# Patient Record
Sex: Male | Born: 1935 | Race: White | Hispanic: No | Marital: Married | Smoking: Former smoker
Health system: Southern US, Community
[De-identification: ages and names within clinical notes are randomized; demographics above are authoritative.]

## PROBLEM LIST (undated history)

## (undated) DIAGNOSIS — E785 Hyperlipidemia, unspecified: Secondary | ICD-10-CM

## (undated) DIAGNOSIS — M1990 Unspecified osteoarthritis, unspecified site: Secondary | ICD-10-CM

## (undated) DIAGNOSIS — I10 Essential (primary) hypertension: Secondary | ICD-10-CM

## (undated) HISTORY — DX: Unspecified osteoarthritis, unspecified site: M19.90

## (undated) HISTORY — PX: EYE SURGERY: SHX253

## (undated) HISTORY — PX: APPENDECTOMY: SHX54

## (undated) HISTORY — PX: CARPAL TUNNEL RELEASE: SHX101

## (undated) HISTORY — DX: Essential (primary) hypertension: I10

## (undated) HISTORY — PX: TONSILLECTOMY: SUR1361

## (undated) HISTORY — DX: Hyperlipidemia, unspecified: E78.5

## (undated) HISTORY — PX: CATARACT EXTRACTION, BILATERAL: SHX1313

---

## 2004-04-17 ENCOUNTER — Encounter: Admission: RE | Admit: 2004-04-17 | Discharge: 2004-04-17 | Payer: Self-pay | Admitting: Internal Medicine

## 2007-11-01 ENCOUNTER — Ambulatory Visit: Payer: Self-pay | Admitting: *Deleted

## 2007-11-01 ENCOUNTER — Encounter: Payer: Self-pay | Admitting: Gastroenterology

## 2007-11-01 ENCOUNTER — Ambulatory Visit: Payer: Self-pay | Admitting: Gastroenterology

## 2007-11-01 DIAGNOSIS — Z860100 Personal history of colon polyps, unspecified: Secondary | ICD-10-CM | POA: Insufficient documentation

## 2007-11-01 DIAGNOSIS — IMO0001 Reserved for inherently not codable concepts without codable children: Secondary | ICD-10-CM

## 2007-11-01 DIAGNOSIS — I10 Essential (primary) hypertension: Secondary | ICD-10-CM | POA: Insufficient documentation

## 2007-12-09 ENCOUNTER — Ambulatory Visit: Payer: Self-pay | Admitting: Gastroenterology

## 2010-11-25 NOTE — Procedures (Signed)
DUPLEX DEEP VENOUS EXAM - LOWER EXTREMITY   INDICATION:  Rule out bilateral DVT.  Patient has bilateral ankle  swelling.   HISTORY:  Edema:  Both ankles.  Trauma/Surgery:  No.  Pain:  No.  PE:  No.  Previous DVT:  No.  Anticoagulants:  Other:   DUPLEX EXAM:                CFV   SFV   PopV  PTV    GSV                R  L  R  L  R  L  R   L  R  L  Thrombosis    o  o  o  o  o  o  o   o  o  o  Spontaneous   +  +  +  +  +  +  +   +  +  +  Phasic        +  +  +  +  +  +  +   +  +  +  Augmentation  +  +  +  +  +  +  +   +  +  +  Compressible  +  +  +  +  +  +  +   +  +  +  Competent     +  +  +  +  +  +  +   +  +  +   Legend:  + - yes  o - no  p - partial  D - decreased    IMPRESSION:  1. No evidence of bilateral deep venous thrombosis.  2. Both posterior tibial artery signals are within normal limits.   Dr. Wylene Simmer was notified of these results, and a preliminary copy was  faxed to his office.      _____________________________  P. Liliane Bade, M.D.   DP/MEDQ  D:  11/01/2007  T:  11/01/2007  Job:  [id number]

## 2011-06-11 ENCOUNTER — Other Ambulatory Visit: Payer: Self-pay | Admitting: Dermatology

## 2013-02-04 ENCOUNTER — Ambulatory Visit (INDEPENDENT_AMBULATORY_CARE_PROVIDER_SITE_OTHER): Payer: Medicare Other | Admitting: Emergency Medicine

## 2013-02-04 VITALS — BP 136/80 | HR 62 | Temp 97.8°F | Resp 20 | Ht 65.0 in | Wt 181.4 lb

## 2013-02-04 DIAGNOSIS — L299 Pruritus, unspecified: Secondary | ICD-10-CM

## 2013-02-04 DIAGNOSIS — R21 Rash and other nonspecific skin eruption: Secondary | ICD-10-CM

## 2013-02-04 LAB — POCT CBC
Granulocyte percent: 60.4 %G (ref 37–80)
HCT, POC: 51.3 % (ref 43.5–53.7)
Hemoglobin: 16.7 g/dL (ref 14.1–18.1)
Lymph, poc: 2.1 (ref 0.6–3.4)
MCH, POC: 31.8 pg — AB (ref 27–31.2)
MCHC: 32.6 g/dL (ref 31.8–35.4)
MCV: 97.8 fL — AB (ref 80–97)
MID (cbc): 0.6 (ref 0–0.9)
MPV: 9.4 fL (ref 0–99.8)
POC Granulocyte: 4.2 (ref 2–6.9)
POC LYMPH PERCENT: 30.7 %L (ref 10–50)
POC MID %: 8.9 %M (ref 0–12)
Platelet Count, POC: 178 10*3/uL (ref 142–424)
RBC: 5.25 M/uL (ref 4.69–6.13)
RDW, POC: 13.8 %
WBC: 7 10*3/uL (ref 4.6–10.2)

## 2013-02-04 NOTE — Patient Instructions (Addendum)
International units cream to the back twice a day. You can try Claritin 10 mg one a day for itching

## 2013-02-04 NOTE — Progress Notes (Signed)
  Subjective:    Patient ID: Stephen Turner, male    DOB: 1922-10-24, 79 y.o.   MRN: [id number]  HPI pt presents with itchy rash on back. It started a [place] weeks ago at the beach. He used spray on sun screen when he noticed itching the day after using the sun screen. He went to dermatologist and was givien Clobetasol which relieved symptoms. He is having trouble sleeping with the itching. Wife does not have sx.  Review of Systems     Objective:   Physical Exam there is a maculopapular rash over the back of his neck. There are isolated raised red areas on the upper and lower extremities Results for orders placed in visit on 02/04/13  POCT CBC      Result Value Range   WBC 7.0  4.6 - 10.2 K/uL   Lymph, poc 2.1  0.6 - 3.4   POC LYMPH PERCENT 30.7  10 - 50 %L   MID (cbc) 0.6  0 - 0.9   POC MID % 8.9  0 - 12 %M   POC Granulocyte 4.2  2 - 6.9   Granulocyte percent 60.4  37 - 80 %G   RBC 5.25  4.69 - 6.13 M/uL   Hemoglobin 16.7  14.1 - 18.1 g/dL   HCT, POC 40.9  81.1 - 53.7 %   MCV 97.8 (*) 80 - 97 fL   MCH, POC 31.8 (*) 27 - 31.2 pg   MCHC 32.6  31.8 - 35.4 g/dL   RDW, POC 91.4     Platelet Count, POC 178  142 - 424 K/uL   MPV 9.4  0 - 99.8 fL         Assessment & Plan:  This still appears to be some type of contact reaction. He will, the clobetasol cream twice a day to his back. I recommended he try either Claritin or Zyrtec one a day for the itching. He will continue with Aveeno. CBC will be done .

## 2013-06-19 ENCOUNTER — Ambulatory Visit
Admission: RE | Admit: 2013-06-19 | Discharge: 2013-06-19 | Disposition: A | Discharge status: Home / Self Care | Payer: Medicare Other | Source: Ambulatory Visit | Attending: Chiropractic Medicine | Admitting: Chiropractic Medicine

## 2013-06-19 ENCOUNTER — Other Ambulatory Visit: Payer: Self-pay | Admitting: Chiropractic Medicine

## 2013-06-19 DIAGNOSIS — M545 Low back pain: Secondary | ICD-10-CM

## 2014-04-11 ENCOUNTER — Encounter: Payer: Self-pay | Admitting: Gastroenterology

## 2016-06-08 ENCOUNTER — Emergency Department (HOSPITAL_COMMUNITY): Payer: Medicare Other

## 2016-06-08 ENCOUNTER — Emergency Department (HOSPITAL_COMMUNITY)
Admission: EM | Admit: 2016-06-08 | Discharge: 2016-06-08 | Disposition: A | Discharge status: Home / Self Care | Payer: Medicare Other | Attending: Emergency Medicine | Admitting: Emergency Medicine

## 2016-06-08 ENCOUNTER — Encounter (HOSPITAL_COMMUNITY): Payer: Self-pay | Admitting: Emergency Medicine

## 2016-06-08 DIAGNOSIS — I10 Essential (primary) hypertension: Secondary | ICD-10-CM | POA: Diagnosis not present

## 2016-06-08 DIAGNOSIS — Z87891 Personal history of nicotine dependence: Secondary | ICD-10-CM | POA: Insufficient documentation

## 2016-06-08 DIAGNOSIS — R0789 Other chest pain: Secondary | ICD-10-CM | POA: Insufficient documentation

## 2016-06-08 DIAGNOSIS — R079 Chest pain, unspecified: Secondary | ICD-10-CM | POA: Diagnosis present

## 2016-06-08 LAB — BASIC METABOLIC PANEL
Anion gap: 7 (ref 5–15)
BUN: 33 mg/dL — ABNORMAL HIGH (ref 6–20)
CO2: 26 mmol/L (ref 22–32)
Calcium: 9.2 mg/dL (ref 8.9–10.3)
Chloride: 104 mmol/L (ref 101–111)
Creatinine, Ser: 1.1 mg/dL (ref 0.61–1.24)
GFR calc Af Amer: >60 mL/min (ref >60)
GFR calc non Af Amer: 57 mL/min — ABNORMAL LOW (ref >60)
Glucose, Bld: 146 mg/dL — ABNORMAL HIGH (ref 65–99)
Potassium: 4.3 mmol/L (ref 3.5–5.1)
Sodium: 137 mmol/L (ref 135–145)

## 2016-06-08 LAB — CBC
HCT: 47.1 % (ref 39.0–52.0)
Hemoglobin: 16.1 g/dL (ref 13.0–17.0)
MCH: 31.8 pg (ref 26.0–34.0)
MCHC: 34.2 g/dL (ref 30.0–36.0)
MCV: 93.1 fL (ref 78.0–100.0)
Platelets: 160 10*3/uL (ref 150–400)
RBC: 5.06 MIL/uL (ref 4.22–5.81)
RDW: 13.5 % (ref 11.5–15.5)
WBC: 5.9 10*3/uL (ref 4.0–10.5)

## 2016-06-08 LAB — I-STAT TROPONIN, ED: Troponin i, poc: 0 ng/mL (ref 0.00–0.08)

## 2016-06-08 NOTE — ED Triage Notes (Signed)
Pt. reports intermittent left chest pain with mild SOB and diaphoresis onset last week , denies emesis or cough .

## 2016-06-08 NOTE — ED Provider Notes (Signed)
[facility] DEPT Provider Note   CSN: [id number] Arrival date & time: 06/08/16  1956     History   Chief Complaint Chief Complaint  Patient presents with  . Chest Pain    HPI BRAEDY SHAWLEY is a 82 y.o. male.  HPI Patient presents with concern of episodic left-sided upper chest pain. Episodes of been present for about one week, without clear precipitant. However, the patient does note that about one week ago his wife, who had a stroke recently, moved back into his house, and he has been lifting her sometimes. However, the pain is not persistent, is not focally about the left shoulder. No dyspnea. Patient states that he is otherwise generally well, takes medication for hypertension prophylaxis. He denies history of cardiac disease. The pain is sore, tight. History reviewed. No pertinent past medical history.  Patient Active Problem List   Diagnosis Date Noted  . BENIGN ESSENTIAL HYPERTENSION PREVIOUS PPC 11/01/2007  . PERSONAL HISTORY OF COLONIC POLYPS 11/01/2007    Past Surgical History:  Procedure Laterality Date  . APPENDECTOMY    . EYE SURGERY    . TONSILLECTOMY         Home Medications    Prior to Admission medications   Medication Sig Start Date End Date Taking? Authorizing Provider  amLODipine (NORVASC) 5 MG tablet Take 5 mg by mouth daily.    Historical Provider, MD  clobetasol cream (TEMOVATE) 0.05 % Apply topically 2 (two) times daily.    Historical Provider, MD  diphenhydrAMINE (BENADRYL) 2 % cream Apply topically 3 (three) times daily as needed for itching.    Historical Provider, MD  hydrocortisone cream 1 % Apply topically 2 (two) times daily.    Historical Provider, MD  ibuprofen (ADVIL,MOTRIN) 200 MG tablet Take 200 mg by mouth every 6 (six) hours as needed for pain.    Historical Provider, MD  lovastatin (MEVACOR) 40 MG tablet Take 40 mg by mouth at bedtime.    Historical Provider, MD    Family History Family History  Problem Relation Age  of Onset  . Stroke Father   . Cancer Brother   . Celiac disease Daughter   . COPD Son   . Heart disease Brother     Social History Social History  Substance Use Topics  . Smoking status: Former Research scientist (life sciences)  . Smokeless tobacco: Never Used  . Alcohol use Yes     Allergies   Patient has no known allergies.   Review of Systems Review of Systems  Constitutional:       Per HPI, otherwise negative  HENT:       Per HPI, otherwise negative  Respiratory:       Per HPI, otherwise negative  Cardiovascular:       Per HPI, otherwise negative  Gastrointestinal: Negative for vomiting.  Endocrine:       Negative aside from HPI  Genitourinary:       Neg aside from HPI   Musculoskeletal:       Per HPI, otherwise negative  Skin: Negative.   Neurological: Negative for syncope.     Physical Exam Updated Vital Signs BP 150/72 (BP Location: Left Arm)   Pulse 80   Temp 97.6 F (36.4 C) (Oral)   Resp 16   Ht 5\' 5"  (1.651 m)   Wt 175 lb (79.4 kg)   SpO2 97%   BMI 29.12 kg/m   Physical Exam  Constitutional: He is oriented to person, place, and time. He appears well-developed.  No distress.  HENT:  Head: Normocephalic and atraumatic.  Eyes: Conjunctivae and EOM are normal.  Cardiovascular: Normal rate and regular rhythm.   Pulmonary/Chest: Effort normal. No stridor. No respiratory distress.  Abdominal: He exhibits no distension.  Musculoskeletal: He exhibits no edema.       Right shoulder: He exhibits decreased range of motion. He exhibits no tenderness and no bony tenderness.       Left shoulder: Normal.       Arms: Neurological: He is alert and oriented to person, place, and time.  Skin: Skin is warm and dry.  Psychiatric: He has a normal mood and affect.  Nursing note and vitals reviewed.    ED Treatments / Results  Labs (all labs ordered are listed, but only abnormal results are displayed) Labs Reviewed  BASIC METABOLIC PANEL - Abnormal; Notable for the following:        Result Value   Glucose, Bld 146 (*)    BUN 33 (*)    GFR calc non Af Amer 57 (*)    All other components within normal limits  CBC  I-STAT TROPOININ, ED    EKG  EKG Interpretation  Date/Time:  Monday June 08 2016 20:12:45 EST Ventricular Rate:  73 PR Interval:  158 QRS Duration: 78 QT Interval:  400 QTC Calculation: 440 R Axis:   31 Text Interpretation:  Normal sinus rhythm Normal ECG Normal ECG Confirmed by Carmin Muskrat  MD [phone]) on 06/08/2016 9:02:46 PM       Radiology Dg Chest 2 View  Result Date: 06/08/2016 CLINICAL DATA:  Chest pain off and on EXAM: CHEST  2 VIEW COMPARISON:  None. FINDINGS: Streaky atelectasis, scar, or infiltrate at the lingula. No focal consolidation or effusion. Heart size within normal limits. Atherosclerosis of the aorta. No pneumothorax. IMPRESSION: Streaky atelectasis, scar or infiltrate at the lingula. Atherosclerosis of the aorta. Electronically Signed   By: Donavan Foil M.D.   On: 06/08/2016 20:59    Procedures Procedures (including critical care time)    Initial Impression / Assessment and Plan / ED Course  I have reviewed the triage vital signs and the nursing notes.  Pertinent labs & imaging results that were available during my care of the patient were reviewed by me and considered in my medical decision making (see chart for details).  Clinical Course     9:48 PM Patient in no distress awake alert. I discussed all findings with him and his family members. We discussed the reassuring labs, EKG, x-ray. We discussed importance of following up with primary care, though tolerates reassuring results, not suspicious for PE, ACS, pneumonia or other acute new pathology. With mild point tenderness about the left sternal border, there suspicion for soft tissue injury. Patient discharged in stable condition. Final Clinical Impressions(s) / ED Diagnoses   Final diagnoses:  Atypical chest pain     Carmin Muskrat,  MD 06/08/16 2149

## 2016-06-08 NOTE — Discharge Instructions (Signed)
As discussed, your evaluation today has been largely reassuring.  But, it is important that you monitor your condition carefully, and do not hesitate to return to the ED if you develop new, or concerning changes in your condition. ? ?Otherwise, please follow-up with your physician for appropriate ongoing care. ? ?

## 2019-07-29 ENCOUNTER — Ambulatory Visit: Payer: Medicare Other | Attending: Internal Medicine

## 2019-07-29 DIAGNOSIS — Z23 Encounter for immunization: Secondary | ICD-10-CM | POA: Insufficient documentation

## 2019-07-29 NOTE — Progress Notes (Signed)
   Covid-19 Vaccination Clinic  Name:  Stephen Turner    MRN: [id number] DOB: 26-Jul-1922  07/29/2019  [name] was observed post Covid-19 immunization for 15 minutes without incidence. He was provided with Vaccine Information Sheet and instruction to access the V-Safe system.   [name] was instructed to call 911 with any severe reactions post vaccine: Marland Kitchen Difficulty breathing  . Swelling of your face and throat  . A fast heartbeat  . A bad rash all over your body  . Dizziness and weakness    Immunizations Administered    Name Date Dose VIS Date Route   Pfizer COVID-19 Vaccine 07/29/2019  1:54 PM 0.3 mL 06/23/2019 Intramuscular   Manufacturer: [place]   Lot: [id number]   [place]: [id number]

## 2019-08-16 ENCOUNTER — Ambulatory Visit: Payer: Medicare Other

## 2019-08-16 ENCOUNTER — Ambulatory Visit: Payer: Medicare Other | Attending: Internal Medicine

## 2019-08-16 DIAGNOSIS — Z23 Encounter for immunization: Secondary | ICD-10-CM | POA: Insufficient documentation

## 2019-08-16 NOTE — Progress Notes (Signed)
COVID-19 Vaccine Information can be found at: [email or web address] For questions related to vaccine distribution or appointments, please email [email or web address] or call [phone].     Covid-19 Vaccination Clinic  Name:  Stephen Turner    MRN: [id number] DOB: 1923-06-21  08/16/2019  Mr. Lemming was observed post Covid-19 immunization for 15 minutes without incidence. He was provided with Vaccine Information Sheet and instruction to access the V-Safe system.   Mr. Cuny was instructed to call 911 with any severe reactions post vaccine: Marland Kitchen Difficulty breathing  . Swelling of your face and throat  . A fast heartbeat  . A bad rash all over your body  . Dizziness and weakness    Immunizations Administered    Name Date Dose VIS Date Route   Pfizer COVID-19 Vaccine 08/16/2019 11:38 AM 0.3 mL 06/23/2019 Intramuscular   Manufacturer: [place]   Lot: [id number]   [place]: [id number]

## 2019-12-13 ENCOUNTER — Encounter: Payer: Self-pay | Admitting: Gastroenterology

## 2020-01-04 ENCOUNTER — Encounter: Payer: Self-pay | Admitting: Gastroenterology

## 2020-01-04 ENCOUNTER — Ambulatory Visit: Payer: Medicare Other | Admitting: Gastroenterology

## 2020-01-04 VITALS — BP 110/60 | HR 68 | Ht 64.0 in | Wt 162.5 lb

## 2020-01-04 DIAGNOSIS — R0989 Other specified symptoms and signs involving the circulatory and respiratory systems: Secondary | ICD-10-CM | POA: Insufficient documentation

## 2020-01-04 DIAGNOSIS — R6889 Other general symptoms and signs: Secondary | ICD-10-CM

## 2020-01-04 DIAGNOSIS — R490 Dysphonia: Secondary | ICD-10-CM | POA: Diagnosis not present

## 2020-01-04 DIAGNOSIS — R1310 Dysphagia, unspecified: Secondary | ICD-10-CM | POA: Diagnosis not present

## 2020-01-04 DIAGNOSIS — K219 Gastro-esophageal reflux disease without esophagitis: Secondary | ICD-10-CM

## 2020-01-04 NOTE — Progress Notes (Signed)
01/04/2020 Stephen Turner [id number] 06-Mar-1923   HISTORY OF PRESENT ILLNESS: This is a 86 year old male who is previously a patient of Dr. Kelby Fam.  His care will be assumed by Dr. Bryan Lemma.  He was last seen here in 2009.  He is here today with complaints of hoarseness in his voice and having to clear his throat a lot.  He says that this began out of nowhere just a couple of months ago.  He first noticed it with the hoarseness while he was talking.  He also notices that he has to clear his throat a lot and brings up a lot of phlegm.  Not having trouble swallowing per se but says that he noticed that something seems different when swallowing.  He indicates it always is up in his throat.  He saw his PCP.  They placed him on omeprazole, which he took every day for couple weeks but said it did not make a difference so he stopped it.  They also gave him some Flonase nasal spray to use which he used for short time as well.  He denies any issues with swallowing food down lower in his esophagus.  He is not choking.  He was just concerned because it has not gone away and he never had any similar issues previously.   Past Medical History:  Diagnosis Date  . Arthritis   . HLD (hyperlipidemia)   . HTN (hypertension)    Past Surgical History:  Procedure Laterality Date  . APPENDECTOMY    . CARPAL TUNNEL RELEASE Bilateral   . CATARACT EXTRACTION, BILATERAL    . TONSILLECTOMY      reports that he quit smoking about 53 years ago. His smoking use included cigarettes. He has never used smokeless tobacco. He reports current alcohol use. He reports that he does not use drugs. family history includes COPD in his son; Celiac disease in his daughter; Leukemia in his brother; Prostate cancer in his brother; Stroke in his father. No Known Allergies    Outpatient Encounter Medications as of 01/04/2020  Medication Sig  . amLODipine (NORVASC) 5 MG tablet Take 5 mg by mouth daily.  Marland Kitchen ibuprofen  (ADVIL,MOTRIN) 200 MG tablet Take 400 mg by mouth in the morning and at bedtime.   . lovastatin (MEVACOR) 40 MG tablet Take 40 mg by mouth at bedtime.  . triamcinolone cream (KENALOG) 0.1 % Apply 1 application topically in the morning, at noon, and at bedtime.  . [DISCONTINUED] clobetasol cream (TEMOVATE) 0.05 % Apply topically 2 (two) times daily.  . [DISCONTINUED] diphenhydrAMINE (BENADRYL) 2 % cream Apply topically 3 (three) times daily as needed for itching.  . [DISCONTINUED] hydrocortisone cream 1 % Apply topically 2 (two) times daily.   No facility-administered encounter medications on file as of 01/04/2020.     REVIEW OF SYSTEMS  : All other systems reviewed and negative except where noted in the History of Present Illness.   PHYSICAL EXAM: BP 110/60 (BP Location: Left Arm, Patient Position: Sitting, Cuff Size: Normal)   Pulse 68   Ht 5\' 4"  (1.626 m)   Wt 162 lb 8 oz (73.7 kg)   BMI 27.89 kg/m  General: Well developed white male in no acute distress Head: Normocephalic and atraumatic Eyes:  Sclerae anicteric, conjunctiva pink. Ears: Normal auditory acuity Neck:  No masses noted. Lungs: Clear throughout to auscultation; no increased WOB. Heart: Regular rate and rhythm; no M/R/G. Abdomen: Soft, non-distended.  BS present.  Non-tender. Musculoskeletal: Symmetrical with  no gross deformities  Skin: No lesions on visible extremities Extremities: No edema  Neurological: Alert oriented x 4, grossly non-focal Psychological:  Alert and cooperative. Normal mood and affect  ASSESSMENT AND PLAN: *86 year old male with complaints of a couple of months of hoarseness in his voice and throat clearing.  Not really dysphagia per se, but he says that it just does not feel right in his throat.  He took omeprazole daily for 2 weeks, but did not notice any difference in his symptoms.  I think he likely will benefit more from an ENT referral/evaluation.  Since he is here we will order an esophagram  to clear the esophagus, but I think this is more an oral pharyngeal/vocal cord issue.  Could still be due to reflux and we discussed possibly trying PPI for total of 4 to 6 weeks.  Question if this is postnasal drip/allergies, but he reports no similar issues in the past.   CC:  Leanna Battles, MD

## 2020-01-04 NOTE — Patient Instructions (Signed)
If you are age 84 or older, your body mass index should be between 23-30. Your Body mass index is 27.89 kg/m. If this is out of the aforementioned range listed, please consider follow up with your Primary Care Provider.  If you are age 32 or younger, your body mass index should be between 19-25. Your Body mass index is 27.89 kg/m. If this is out of the aformentioned range listed, please consider follow up with your Primary Care Provider.   You have been scheduled for a Barium Esophogram at [facility] (1st floor of the hospital) on Wednesday 01/10/20 at 9:30 am. Please arrive 15 minutes prior to your appointment for registration. Make certain not to have anything to eat or drink 3 hours prior to your test. If you need to reschedule for any reason, please contact radiology at [phone] to do so. __________________________________________________________________ A barium swallow is an examination that concentrates on views of the esophagus. This tends to be a double contrast exam (barium and two liquids which, when combined, create a gas to distend the wall of the oesophagus) or single contrast (non-ionic iodine based). The study is usually tailored to your symptoms so a good history is essential. Attention is paid during the study to the form, structure and configuration of the esophagus, looking for functional disorders (such as aspiration, dysphagia, achalasia, motility and reflux) EXAMINATION You may be asked to change into a gown, depending on the type of swallow being performed. A radiologist and radiographer will perform the procedure. The radiologist will advise you of the type of contrast selected for your procedure and direct you during the exam. You will be asked to stand, sit or lie in several different positions and to hold a small amount of fluid in your mouth before being asked to swallow while the imaging is performed .In some instances you may be asked to swallow barium coated  marshmallows to assess the motility of a solid food bolus. The exam can be recorded as a digital or video fluoroscopy procedure. POST PROCEDURE It will take 1-2 days for the barium to pass through your system. To facilitate this, it is important, unless otherwise directed, to increase your fluids for the next 24-48hrs and to resume your normal diet.  This test typically takes about 30 minutes to perform. ________________________________________________________________

## 2020-01-10 ENCOUNTER — Ambulatory Visit (HOSPITAL_COMMUNITY)
Admission: RE | Admit: 2020-01-10 | Discharge: 2020-01-10 | Disposition: A | Discharge status: Home / Self Care | Payer: Medicare Other | Source: Ambulatory Visit | Attending: Gastroenterology | Admitting: Gastroenterology

## 2020-01-10 ENCOUNTER — Other Ambulatory Visit: Payer: Self-pay

## 2020-01-10 DIAGNOSIS — K219 Gastro-esophageal reflux disease without esophagitis: Secondary | ICD-10-CM | POA: Insufficient documentation

## 2020-01-10 DIAGNOSIS — R1310 Dysphagia, unspecified: Secondary | ICD-10-CM | POA: Diagnosis present

## 2020-01-10 DIAGNOSIS — R6889 Other general symptoms and signs: Secondary | ICD-10-CM | POA: Insufficient documentation

## 2020-01-10 DIAGNOSIS — R0989 Other specified symptoms and signs involving the circulatory and respiratory systems: Secondary | ICD-10-CM

## 2020-01-10 DIAGNOSIS — R490 Dysphonia: Secondary | ICD-10-CM | POA: Insufficient documentation

## 2020-01-11 ENCOUNTER — Other Ambulatory Visit: Payer: Self-pay

## 2020-01-11 DIAGNOSIS — R0989 Other specified symptoms and signs involving the circulatory and respiratory systems: Secondary | ICD-10-CM

## 2020-01-11 DIAGNOSIS — R490 Dysphonia: Secondary | ICD-10-CM

## 2020-01-24 NOTE — Progress Notes (Signed)
Agree with the assessment and plan as outlined by Alonza Bogus, PA-C.  Agree that symptoms could be atypical reflux.  Pending ENT evaluation, can also consider repeat medication trial with short course of high-dose PPI (Protonix 40 mg p.o. BID x4 weeks) for diagnostic and therapeutic intent.  Esophagram reasonable as well.  Javontay Vandam, DO, [facility]

## 2020-01-25 ENCOUNTER — Encounter (INDEPENDENT_AMBULATORY_CARE_PROVIDER_SITE_OTHER): Payer: Self-pay | Admitting: Otolaryngology

## 2020-01-25 ENCOUNTER — Other Ambulatory Visit: Payer: Self-pay

## 2020-01-25 ENCOUNTER — Ambulatory Visit (INDEPENDENT_AMBULATORY_CARE_PROVIDER_SITE_OTHER): Payer: Medicare Other | Admitting: Otolaryngology

## 2020-01-25 VITALS — Temp 97.5°F

## 2020-01-25 DIAGNOSIS — K219 Gastro-esophageal reflux disease without esophagitis: Secondary | ICD-10-CM

## 2020-01-25 DIAGNOSIS — R490 Dysphonia: Secondary | ICD-10-CM

## 2020-01-25 NOTE — Progress Notes (Signed)
HPI: Stephen Turner is a 86 y.o. male who presents is referred by PCP for evaluation of hoarseness and chronic throat clearing.  His hoarseness apparently was worse several weeks ago and has gradually gotten better.Marland Kitchen  He also complains of a lot of throat phlegm that builds up low in his throat and points to the region of the larynx.  He apparently had a barium swallow that was essentially normal except for some mild retention of barium in the hypopharynx that cleared with swallowing.  He was treated with omeprazole but has not been taking that recently as he had a breakout in his skin that he thought might be related to omeprazole.  He also occasionally has a dull soreness in the throat and points to the area of the larynx and cricoid cartilage. On talking with him in the office today he has minimal hoarseness.  No sore throat.  Past Medical History:  Diagnosis Date  . Arthritis   . HLD (hyperlipidemia)   . HTN (hypertension)    Past Surgical History:  Procedure Laterality Date  . APPENDECTOMY    . CARPAL TUNNEL RELEASE Bilateral   . CATARACT EXTRACTION, BILATERAL    . TONSILLECTOMY     Social History   Socioeconomic History  . Marital status: Married    Spouse name: Not on file  . Number of children: 3  . Years of education: Not on file  . Highest education level: Not on file  Occupational History  . Occupation: retired  Tobacco Use  . Smoking status: Former Smoker    Packs/day: 0.50    Years: 20.00    Pack years: 10.00    Types: Cigarettes    Quit date: 1968    Years since quitting: 53.5  . Smokeless tobacco: Never Used  Vaping Use  . Vaping Use: Never used  Substance and Sexual Activity  . Alcohol use: Yes    Comment: 1 per week  . Drug use: No  . Sexual activity: Never  Other Topics Concern  . Not on file  Social History Narrative  . Not on file   Social Determinants of Health   Financial Resource Strain:   . Difficulty of Paying Living Expenses:   Food  Insecurity:   . Worried About Charity fundraiser in the Last Year:   . Arboriculturist in the Last Year:   Transportation Needs:   . Film/video editor (Medical):   Marland Kitchen Lack of Transportation (Non-Medical):   Physical Activity:   . Days of Exercise per Week:   . Minutes of Exercise per Session:   Stress:   . Feeling of Stress :   Social Connections:   . Frequency of Communication with Friends and Family:   . Frequency of Social Gatherings with Friends and Family:   . Attends Religious Services:   . Active Member of Clubs or Organizations:   . Attends Archivist Meetings:   Marland Kitchen Marital Status:    Family History  Problem Relation Age of Onset  . Stroke Father   . Leukemia Brother   . Celiac disease Daughter   . COPD Son   . Prostate cancer Brother    No Known Allergies Prior to Admission medications   Medication Sig Start Date End Date Taking? Authorizing Provider  amLODipine (NORVASC) 5 MG tablet Take 5 mg by mouth daily.   Yes [provider]  ibuprofen (ADVIL,MOTRIN) 200 MG tablet Take 400 mg by mouth in the morning  and at bedtime.    Yes [provider]  lovastatin (MEVACOR) 40 MG tablet Take 40 mg by mouth at bedtime.   Yes [provider]  triamcinolone cream (KENALOG) 0.1 % Apply 1 application topically in the morning, at noon, and at bedtime. 12/22/19  Yes [provider]     Positive ROS: Otherwise negative  All other systems have been reviewed and were otherwise negative with the exception of those mentioned in the HPI and as above.  Physical Exam: Constitutional: Alert, well-appearing, no acute distress Ears: External ears without lesions or tenderness. Ear canals are clear bilaterally with intact, clear TMs.  Nasal: External nose without lesions. Septum midline with mild rhinitis and clear mucus discharge.  Both middle meatus regions were clear with no signs of infection..  Oral: Lips and gums without lesions. Tongue  and palate mucosa without lesions. Posterior oropharynx clear. Fiberoptic laryngoscopy was performed through the right nostril.  The nasopharynx was clear.  The base of tongue vallecula and epiglottis were normal.  Piriform sinuses were clear bilaterally.  Vocal cords were clear bilaterally with normal vocal cord mobility and no vocal cord lesions.  He had mild bowing of the vocal cords but no erythema or swelling. Neck: No palpable adenopathy or masses.  No palpable thyroid masses. Respiratory: Breathing comfortably  Skin: No facial/neck lesions or rash noted.  Laryngoscopy  Date/Time: 01/25/2020 5:32 PM Performed by: Rozetta Nunnery, MD Authorized by: Rozetta Nunnery, MD   Consent:    Consent obtained:  Verbal   Consent given by:  Patient Procedure details:    Indications: hoarseness, dysphagia, or aspiration     Medication:  Afrin   Instrument: flexible fiberoptic laryngoscope     Scope location: right nare   Sinus:    Right nasopharynx: normal   Mouth:    Oropharynx: normal     Vallecula: normal     Base of tongue: normal     Epiglottis: normal   Throat:    Pyriform sinus: normal     True vocal cords: normal   Comments:     On fiberoptic laryngoscopy the hypopharynx and larynx was clear.  Vocal cords were normal with normal vocal cord mobility bilaterally.    Assessment: Hoarseness and throat symptoms most likely related to laryngeal pharyngeal reflux. No evidence of neoplasia or other pathology on fiberoptic laryngoscopy.  Plan: Briefly discussed with him that if the throat becomes sore or he has hoarseness I would recommend taking the omeprazole which he has been previously prescribed and take it before dinner to provide better nighttime coverage however if he is having minimal symptoms would not necessarily recommend taking antacid therapy. Reassured him of no evidence of neoplasia.   Radene Journey, MD   CC:

## 2020-05-22 ENCOUNTER — Other Ambulatory Visit (HOSPITAL_COMMUNITY): Payer: Self-pay | Admitting: Internal Medicine

## 2020-05-22 DIAGNOSIS — M79605 Pain in left leg: Secondary | ICD-10-CM

## 2020-05-24 ENCOUNTER — Ambulatory Visit (HOSPITAL_COMMUNITY)
Admission: RE | Admit: 2020-05-24 | Discharge: 2020-05-24 | Disposition: A | Discharge status: Home / Self Care | Payer: Medicare Other | Source: Ambulatory Visit | Attending: Internal Medicine | Admitting: Internal Medicine

## 2020-05-24 ENCOUNTER — Other Ambulatory Visit: Payer: Self-pay

## 2020-05-24 DIAGNOSIS — M7989 Other specified soft tissue disorders: Secondary | ICD-10-CM

## 2020-05-24 DIAGNOSIS — M79605 Pain in left leg: Secondary | ICD-10-CM | POA: Insufficient documentation

## 2020-07-08 ENCOUNTER — Other Ambulatory Visit: Payer: Self-pay

## 2020-07-08 ENCOUNTER — Encounter (HOSPITAL_COMMUNITY): Payer: Self-pay | Admitting: Emergency Medicine

## 2020-07-08 ENCOUNTER — Emergency Department (HOSPITAL_COMMUNITY)
Admission: EM | Admit: 2020-07-08 | Discharge: 2020-07-09 | Disposition: A | Discharge status: Home / Self Care | Payer: Medicare Other | Attending: Emergency Medicine | Admitting: Emergency Medicine

## 2020-07-08 DIAGNOSIS — R072 Precordial pain: Secondary | ICD-10-CM | POA: Diagnosis not present

## 2020-07-08 DIAGNOSIS — Z7982 Long term (current) use of aspirin: Secondary | ICD-10-CM | POA: Diagnosis not present

## 2020-07-08 DIAGNOSIS — R0789 Other chest pain: Secondary | ICD-10-CM | POA: Diagnosis present

## 2020-07-08 DIAGNOSIS — E785 Hyperlipidemia, unspecified: Secondary | ICD-10-CM | POA: Diagnosis not present

## 2020-07-08 DIAGNOSIS — Z87891 Personal history of nicotine dependence: Secondary | ICD-10-CM | POA: Diagnosis not present

## 2020-07-08 DIAGNOSIS — I10 Essential (primary) hypertension: Secondary | ICD-10-CM | POA: Diagnosis not present

## 2020-07-08 DIAGNOSIS — R61 Generalized hyperhidrosis: Secondary | ICD-10-CM | POA: Insufficient documentation

## 2020-07-08 DIAGNOSIS — I209 Angina pectoris, unspecified: Secondary | ICD-10-CM | POA: Diagnosis not present

## 2020-07-08 DIAGNOSIS — Z79899 Other long term (current) drug therapy: Secondary | ICD-10-CM | POA: Insufficient documentation

## 2020-07-08 LAB — TROPONIN I (HIGH SENSITIVITY)
Troponin I (High Sensitivity): 4 ng/L (ref <18)
Troponin I (High Sensitivity): 6 ng/L (ref <18)

## 2020-07-08 LAB — BASIC METABOLIC PANEL
Anion gap: 9 (ref 5–15)
BUN: 33 mg/dL — ABNORMAL HIGH (ref 8–23)
CO2: 27 mmol/L (ref 22–32)
Calcium: 9.1 mg/dL (ref 8.9–10.3)
Chloride: 102 mmol/L (ref 98–111)
Creatinine, Ser: 1.04 mg/dL (ref 0.61–1.24)
GFR, Estimated: >60 mL/min (ref >60)
Glucose, Bld: 121 mg/dL — ABNORMAL HIGH (ref 70–99)
Potassium: 4.1 mmol/L (ref 3.5–5.1)
Sodium: 138 mmol/L (ref 135–145)

## 2020-07-08 LAB — CBC
HCT: 47.4 % (ref 39.0–52.0)
Hemoglobin: 15.3 g/dL (ref 13.0–17.0)
MCH: 30.9 pg (ref 26.0–34.0)
MCHC: 32.3 g/dL (ref 30.0–36.0)
MCV: 95.8 fL (ref 80.0–100.0)
Platelets: 160 10*3/uL (ref 150–400)
RBC: 4.95 MIL/uL (ref 4.22–5.81)
RDW: 12.7 % (ref 11.5–15.5)
WBC: 6 10*3/uL (ref 4.0–10.5)
nRBC: 0 % (ref 0.0–0.2)

## 2020-07-08 NOTE — ED Triage Notes (Signed)
Pt presents to ED POV. Pt c/o mid CP that radiates to jaw. Pt states that he was clammy with CP. No cardiac hx. Pt taken nitro x2 and pain subsided

## 2020-07-09 ENCOUNTER — Emergency Department (HOSPITAL_COMMUNITY): Payer: Medicare Other

## 2020-07-09 DIAGNOSIS — I10 Essential (primary) hypertension: Secondary | ICD-10-CM

## 2020-07-09 DIAGNOSIS — I209 Angina pectoris, unspecified: Secondary | ICD-10-CM

## 2020-07-09 DIAGNOSIS — E785 Hyperlipidemia, unspecified: Secondary | ICD-10-CM

## 2020-07-09 DIAGNOSIS — R072 Precordial pain: Secondary | ICD-10-CM

## 2020-07-09 LAB — TROPONIN I (HIGH SENSITIVITY): Troponin I (High Sensitivity): 6 ng/L (ref <18)

## 2020-07-09 MED ORDER — ASPIRIN EC 81 MG PO TBEC: 81.0000 mg | DELAYED_RELEASE_TABLET | Freq: Every day | ORAL | 11 refills | Status: AC

## 2020-07-09 MED ORDER — NITROGLYCERIN 0.4 MG SL SUBL: 0.4000 mg | SUBLINGUAL_TABLET | SUBLINGUAL | 2 refills | Status: AC | PRN

## 2020-07-09 MED ORDER — METOPROLOL TARTRATE 25 MG PO TABS: 12.5000 mg | ORAL_TABLET | Freq: Two times a day (BID) | ORAL | 1 refills | Status: DC

## 2020-07-09 NOTE — ED Notes (Signed)
Patient transported to x-ray. ?

## 2020-07-09 NOTE — Consult Note (Addendum)
Cardiology Consultation:   Patient ID: Stephen Turner MRN: [id number]; DOB: 09/27/1922  Admit date: 07/08/2020 Date of Consult: 07/09/2020  Primary Care Provider: Jarome Matin, MD [facility] Cardiologist: Nicki Guadalajara, MD (new) [facility] Electrophysiologist:  None    Patient Profile:   Stephen Turner is a 86 y.o. male with a hx of HTN and HL who is being seen today for the evaluation of chest pain at the request of Dr. Wilkie Aye.  History of Present Illness:   Stephen Turner is a 87 yo male with PMH noted above. He has never seen a cardiologist in the past. Presented to the ED with chest pain. Denies any significant family hx of CAD. Hx of tobacco use but quit back in the 1960s.   He is very active for 86yo. Lives independently and drive still. Says he was in his usual state of health until yesterday around 3pm. He was driving to see a friend at [facility]. Developed centralized chest pain that was pressure like. When he got to his friend's place the pressure worsened. He debated asking them to call the RN there but decided not too. Pressure increased and radiated into the neck and jaw. His teeth also hurt. He was able to drive home and took 2 SL nitro he had been given by his PCP in the past. Thinks they may have been about 6 months ago. Did have some improvement with symptoms. Told his son what happened and they presented to the ED for further evaluation.   In the ED his labs showed stable electrolytes, Cr 1.04, hsTn 4>>6>>6, WBC 6.0, HGb 15.3. CXR negative. EKG showed SR 68 bpm with PVC, non specific changes. He has not had any recurrence of chest pain since arriving to the ED.    Past Medical History:  Diagnosis Date  . Arthritis   . HLD (hyperlipidemia)   . HTN (hypertension)     Past Surgical History:  Procedure Laterality Date  . APPENDECTOMY    . CARPAL TUNNEL RELEASE Bilateral   . CATARACT EXTRACTION, BILATERAL    . TONSILLECTOMY       Home Medications:   Prior to Admission medications   Medication Sig Start Date End Date Taking? Authorizing Provider  amLODipine (NORVASC) 5 MG tablet Take 5 mg by mouth daily.   Yes [provider]  gabapentin (NEURONTIN) 100 MG capsule Take 100 mg by mouth at bedtime.   Yes [provider]  ibuprofen (ADVIL,MOTRIN) 200 MG tablet Take 400 mg by mouth in the morning and at bedtime.   Yes [provider]  lovastatin (MEVACOR) 40 MG tablet Take 40 mg by mouth at bedtime.   Yes [provider]  Probiotic Product (PROBIOTIC DAILY PO) Take 1 capsule by mouth in the morning.   Yes [provider]  triamcinolone cream (KENALOG) 0.1 % Apply 1 application topically in the morning, at noon, and at bedtime. 12/22/19  Yes [provider]    Inpatient Medications: Scheduled Meds:  Continuous Infusions:  PRN Meds:  Allergies:   No Known Allergies  Social History:   Social History   Socioeconomic History  . Marital status: Married    Spouse name: Not on file  . Number of children: 3  . Years of education: Not on file  . Highest education level: Not on file  Occupational History  . Occupation: retired  Tobacco Use  . Smoking status: Former Smoker    Packs/day: 0.50    Years: 20.00  Pack years: 10.00    Types: Cigarettes    Quit date: 69    Years since quitting: 54.0  . Smokeless tobacco: Never Used  Vaping Use  . Vaping Use: Never used  Substance and Sexual Activity  . Alcohol use: Yes    Comment: 1 per week  . Drug use: No  . Sexual activity: Never  Other Topics Concern  . Not on file  Social History Narrative  . Not on file   Social Determinants of Health   Financial Resource Strain: Not on file  Food Insecurity: Not on file  Transportation Needs: Not on file  Physical Activity: Not on file  Stress: Not on file  Social Connections: Not on file  Intimate Partner Violence: Not on file    Family History:    Family History  Problem  Relation Age of Onset  . Stroke Father   . Leukemia Brother   . Celiac disease Daughter   . COPD Son   . Prostate cancer Brother      ROS:  Please see the history of present illness.   All other ROS reviewed and negative.     Physical Exam/Data:   Vitals:   07/09/20 0800 07/09/20 0900 07/09/20 0930 07/09/20 1000  BP: 137/80 137/83 116/70 (!) 150/78  Pulse: 64 67 62 68  Resp: 14 17 13 16   Temp:      TempSrc:      SpO2: 96% 95% 92% 96%   No intake or output data in the 24 hours ending 07/09/20 1042 Last 3 Weights 01/04/2020 06/08/2016 02/04/2013  Weight (lbs) 162 lb 8 oz 175 lb 181 lb 6.4 oz  Weight (kg) 73.71 kg 79.379 kg 82.283 kg     There is no height or weight on file to calculate BMI.  General:  Well nourished, well developed, in no acute distress HEENT: normal Lymph: no adenopathy Neck: no JVD Endocrine:  No thryomegaly Vascular: No carotid bruits; FA pulses 2+ bilaterally without bruits  Cardiac:  normal S1, S2; RRR; no murmur  Lungs:  clear to auscultation bilaterally, no wheezing, rhonchi or rales  Abd: soft, nontender, no hepatomegaly  Ext: no edema Musculoskeletal:  No deformities, BUE and BLE strength normal and equal Skin: warm and dry  Neuro:  CNs 2-12 intact, no focal abnormalities noted Psych:  Normal affect   EKG:  The EKG was personally reviewed and demonstrates:  SR 68 bpm, PVC, nonspecific changes  Relevant CV Studies:  N/a   Laboratory Data:  High Sensitivity Troponin:   Recent Labs  Lab 07/08/20 1813 07/08/20 2106 07/09/20 0830  TROPONINIHS 4 6 6      Chemistry Recent Labs  Lab 07/08/20 1813  NA 138  K 4.1  CL 102  CO2 27  GLUCOSE 121*  BUN 33*  CREATININE 1.04  CALCIUM 9.1  GFRNONAA >60  ANIONGAP 9    No results for input(s): PROT, ALBUMIN, AST, ALT, ALKPHOS, BILITOT in the last 168 hours. Hematology Recent Labs  Lab 07/08/20 1813  WBC 6.0  RBC 4.95  HGB 15.3  HCT 47.4  MCV 95.8  MCH 30.9  MCHC 32.3  RDW 12.7   PLT 160   BNPNo results for input(s): BNP, PROBNP in the last 168 hours.  DDimer No results for input(s): DDIMER in the last 168 hours.   Radiology/Studies:  DG Chest 2 View  Result Date: 07/09/2020 CLINICAL DATA:  Chest pain. EXAM: CHEST - 2 VIEW COMPARISON:  Prior chest radiographs 06/08/2016 and earlier.  FINDINGS: Heart size within normal limits. Aortic atherosclerosis. No appreciable airspace consolidation or pulmonary edema. No evidence of pleural effusion or pneumothorax. No acute bony abnormality identified. IMPRESSION: No evidence of acute cardiopulmonary abnormality. Aortic Atherosclerosis (ICD10-I70.0). Electronically Signed   By: Kellie Simmering DO   On: 07/09/2020 07:53     Assessment and Plan:   Stephen Turner is a 86 y.o. male with a hx of HTN and HL who is being seen today for the evaluation of chest pain at the request of Dr. Dina Rich.  1. Chest pain: developed episode while driving to see a friend yesterday. Episode lasted for several hours with radiation into the jaw and teeth. Did have some improvement with SL nitro. hsTn negative x3. EKG without acute ischemia. No further chest pain since presenting to the ED. Given negative enzymes and no ischemic changes on EKG, reasonable to start him on GDMT and follow up in the office. If recurrence of symptoms, then consider further ischemic work up. Will add ASA, low dose metoprolol 12.5mg  BID and give new Rx for SL nitro.  -- will arrange for outpatient follow up in the office.   2. HTN: reports being complaint with home medications. Blood pressures are generally very well controlled.  -- continue norvasc  3. HLD: on lovastatin  HEAR Score (for undifferentiated chest pain):  HEAR Score: 5     For questions or updates, please contact [place] Please consult [email or web address] for contact info under    Signed, Reino Bellis, NP  07/09/2020 10:42 AM   Patient seen and examined. Agree with assessment and plan.  Stephen Turner is a very pleasant very active 86 year old gentleman who is followed by Dr. Bevelyn Buckles for primary care.  He lives independently and continues to drive.  Several years ago he had experienced some vague chest discomfort.  He was given a prescription for sublingual nitroglycerin to take as needed.  He had never used this until yesterday when he developed centralized chest pressure when visiting a friend at friend's home [place].  The chest pain radiated to his neck and jaw.  Upon arriving home he took 2 sublingual nitroglycerin and noted improvement.  He ultimately presented to the emergency room for further evaluation.  Troponins are negative.  ECG does not show any acute ischemic changes.  He has not had any recurrent chest pain since being in the emergency room overnight.  Telemetry shows sinus rhythm in the 60s with an occasional PVC.  He appears younger than stated age.  HEENT was unremarkable.  There were no carotid bruits.  Lungs were clear.  He did not have chest wall tenderness to palpation.  Rhythm was regular with no S3 or S4 gallop.  Abdomen was nontender.  Pulses were adequate.  There was no edema.  Neurologic exam was grossly nonfocal.  Chest x-ray did not reveal any evidence for acute cardiopulmonary abnormality.  There was evidence for aortic atherosclerosis.  He has a history of hyperlipidemia and has been on lovastatin as well as a history of hypertension for which he has been on amlodipine 5 mg daily.  It is very possible that he did experience anginal symptomatology yesterday due to coronary atherosclerosis.  His ECG is unremarkable and troponins are negative.  In the emergency room he has remained stable and is not on any nitroglycerin or heparin therapy.  Presently I feel we can discharge him from the emergency room today.  I recommend initiation of low-dose metoprolol succinate 12.5 mg  which would be helpful for some blood pressure lability, PVC suppression as well as anti-ischemic  benefit.  I would give him new sublingual nitroglycerin since his prescription that he had had previously is outdated.  Recommend continuation of amlodipine 5 mg daily.  If recurrent chest pain symptomatology consider adding low-dose isosorbide as an outpatient with plans for subsequent imaging analysis.  Recommend follow-up in several weeks and target LDL less than 70.  Consider initiation of low-dose aspirin 81 mg aortic atherosclerosis and probable mild anginal symptomatology.   Troy Sine, MD, [facility] 07/09/2020 10:49 AM

## 2020-07-09 NOTE — ED Notes (Signed)
Dr Tresa Endo at bedside

## 2020-07-09 NOTE — ED Provider Notes (Signed)
[place] EMERGENCY DEPARTMENT Provider Note   CSN: [id number] Arrival date & time: 07/08/20  1737     History Chief Complaint  Patient presents with  . Chest Pain    Stephen Turner is a 86 y.o. male.  HPI  HPI: A 86 year old patient with a history of hypertension and hypercholesterolemia presents for evaluation of chest pain. Initial onset of pain was more than 6 hours ago. The patient's chest pain is described as heaviness/pressure/tightness, is not worse with exertion and is relieved by nitroglycerin. The patient reports some diaphoresis. The patient's chest pain is middle- or left-sided, is not well-localized, is not sharp and does radiate to the arms/jaw/neck. The patient does not complain of nausea. The patient has no history of stroke, has no history of peripheral artery disease, has not smoked in the past 90 days, denies any history of treated diabetes, has no relevant family history of coronary artery disease (first degree relative at less than age 30) and does not have an elevated BMI (>=30).    This is a 86 year old male with a history of hypertension and hyperlipidemia who presents with chest pain.  Patient reports onset of chest pain yesterday while going to see a friend in a living facility.  He states that the pain was anterior and pressure-like.  He also had some left jaw pain and diaphoresis.  No shortness of breath.  Not particularly exertional nature.  No cough or recent fevers.  He states the pain lasted for "some time."  He took 2 nitroglycerin and pain ultimately resolved by the time he arrived here last night approximately 14 hours ago.  He has not had any recurrent pain since.  He did have one episode of similar pain 2 nights ago which he thought might be indigestion.  He has no known history of coronary artery disease and has never had any evaluation.  He does state that he had a similar episode of pain in 2018.  He was seen by his primary doctor but  denies any cardiac testing at that time.  He was prescribed nitroglycerin at that time but has never had to use it.  Denies new lower extremity swelling or history of blood clots.   Past Medical History:  Diagnosis Date  . Arthritis   . HLD (hyperlipidemia)   . HTN (hypertension)     Patient Active Problem List   Diagnosis Date Noted  . Hoarseness 01/04/2020  . Throat clearing 01/04/2020  . Gastroesophageal reflux disease 01/04/2020  . BENIGN ESSENTIAL HYPERTENSION PREVIOUS PPC 11/01/2007  . PERSONAL HISTORY OF COLONIC POLYPS 11/01/2007    Past Surgical History:  Procedure Laterality Date  . APPENDECTOMY    . CARPAL TUNNEL RELEASE Bilateral   . CATARACT EXTRACTION, BILATERAL    . TONSILLECTOMY         Family History  Problem Relation Age of Onset  . Stroke Father   . Leukemia Brother   . Celiac disease Daughter   . COPD Son   . Prostate cancer Brother     Social History   Tobacco Use  . Smoking status: Former Smoker    Packs/day: 0.50    Years: 20.00    Pack years: 10.00    Types: Cigarettes    Quit date: 1968    Years since quitting: 54.0  . Smokeless tobacco: Never Used  Vaping Use  . Vaping Use: Never used  Substance Use Topics  . Alcohol use: Yes    Comment: 1  per week  . Drug use: No    Home Medications Prior to Admission medications   Medication Sig Start Date End Date Taking? Authorizing Provider  amLODipine (NORVASC) 5 MG tablet Take 5 mg by mouth daily.   Yes [provider]  aspirin EC 81 MG tablet Take 1 tablet (81 mg total) by mouth daily. Swallow whole. 07/09/20  Yes Reino Bellis B, NP  gabapentin (NEURONTIN) 100 MG capsule Take 100 mg by mouth at bedtime.   Yes [provider]  ibuprofen (ADVIL,MOTRIN) 200 MG tablet Take 400 mg by mouth in the morning and at bedtime.   Yes [provider]  lovastatin (MEVACOR) 40 MG tablet Take 40 mg by mouth at bedtime.   Yes [provider]  metoprolol tartrate  (LOPRESSOR) 25 MG tablet Take 0.5 tablets (12.5 mg total) by mouth 2 (two) times daily. 07/09/20 11/06/20 Yes Cheryln Manly, NP  nitroGLYCERIN (NITROSTAT) 0.4 MG SL tablet Place 1 tablet (0.4 mg total) under the tongue every 5 (five) minutes as needed. 07/09/20  Yes Cheryln Manly, NP  Probiotic Product (PROBIOTIC DAILY PO) Take 1 capsule by mouth in the morning.   Yes [provider]  triamcinolone cream (KENALOG) 0.1 % Apply 1 application topically in the morning, at noon, and at bedtime. 12/22/19  Yes [provider]    Allergies    Patient has no known allergies.  Review of Systems   Review of Systems  Constitutional: Positive for diaphoresis. Negative for fever.  Respiratory: Negative for cough and shortness of breath.   Cardiovascular: Positive for chest pain. Negative for leg swelling.  Gastrointestinal: Negative for abdominal pain, nausea and vomiting.  Genitourinary: Negative for dysuria.  All other systems reviewed and are negative.   Physical Exam Updated Vital Signs BP (!) 150/78   Pulse 68   Temp 98 F (36.7 C) (Oral)   Resp 16   Ht 1.626 m (5\' 4" )   Wt 73.7 kg   SpO2 96%   BMI 27.89 kg/m   Physical Exam Vitals and nursing note reviewed.  Constitutional:      Appearance: He is well-developed and well-nourished.     Comments: Appears younger stated age  HENT:     Head: Normocephalic and atraumatic.  Eyes:     Pupils: Pupils are equal, round, and reactive to light.  Cardiovascular:     Rate and Rhythm: Normal rate and regular rhythm.     Heart sounds: Normal heart sounds. No murmur heard.   Pulmonary:     Effort: Pulmonary effort is normal. No respiratory distress.     Breath sounds: Normal breath sounds. No wheezing.  Abdominal:     General: Bowel sounds are normal.     Palpations: Abdomen is soft.     Tenderness: There is no abdominal tenderness. There is no rebound.  Musculoskeletal:        General: No edema.     Cervical  back: Neck supple.     Right lower leg: No tenderness. No edema.     Left lower leg: No tenderness. No edema.  Lymphadenopathy:     Cervical: No cervical adenopathy.  Skin:    General: Skin is warm and dry.  Neurological:     Mental Status: He is alert and oriented to person, place, and time.  Psychiatric:        Mood and Affect: Mood and affect and mood normal.     ED Results / Procedures / Treatments  Labs (all labs ordered are listed, but only abnormal results are displayed) Labs Reviewed  BASIC METABOLIC PANEL - Abnormal; Notable for the following components:      Result Value   Glucose, Bld 121 (*)    BUN 33 (*)    All other components within normal limits  CBC  TROPONIN I (HIGH SENSITIVITY)  TROPONIN I (HIGH SENSITIVITY)  TROPONIN I (HIGH SENSITIVITY)    EKG EKG Interpretation  Date/Time:  Monday July 08 2020 17:56:02 EST Ventricular Rate:  68 PR Interval:  172 QRS Duration: 74 QT Interval:  368 QTC Calculation: 391 R Axis:   -23 Text Interpretation: Normal sinus rhythm Premature ventricular complexes Otherwise within normal limits When compared with ECG of 06/08/2016, Premature ventricular complexes are now present Confirmed by Delora Fuel ([id number]) on 07/09/2020 12:49:07 AM Also confirmed by Thayer Jew [phone])  on 07/09/2020 7:37:05 AM   Radiology DG Chest 2 View  Result Date: 07/09/2020 CLINICAL DATA:  Chest pain. EXAM: CHEST - 2 VIEW COMPARISON:  Prior chest radiographs 06/08/2016 and earlier. FINDINGS: Heart size within normal limits. Aortic atherosclerosis. No appreciable airspace consolidation or pulmonary edema. No evidence of pleural effusion or pneumothorax. No acute bony abnormality identified. IMPRESSION: No evidence of acute cardiopulmonary abnormality. Aortic Atherosclerosis (ICD10-I70.0). Electronically Signed   By: Kellie Simmering DO   On: 07/09/2020 07:53    Procedures Procedures (including critical care time)  Medications Ordered in  ED Medications - No data to display  ED Course  I have reviewed the triage vital signs and the nursing notes.  Pertinent labs & imaging results that were available during my care of the patient were reviewed by me and considered in my medical decision making (see chart for details).  Clinical Course as of 07/09/20 1055  Tue Jul 09, 2020  0800 Spoke with cardiology.  They will evaluate the patient for further disposition. [CH]    Clinical Course User Index [CH] Alvie Fowles, Barbette Hair, MD   MDM Rules/Calculators/A&P HEAR Score: 5                        Patient presents with chest pain.  He is overall nontoxic and vital signs are reassuring.  He is very well-appearing for 86 year old.  He has been in the waiting room greater than 12 hours.  His initial EKG is reassuring without ischemic or arrhythmic changes.  Troponin x2 is flat and negative.  However, his history and character of pain is somewhat concerning including that it improved with nitroglycerin.  He has not ever had an ischemic evaluation.  We will add a chest x-ray and repeat troponin.  We will have cardiology evaluate as he is high risk with a heart score of 5.  He may be appropriate for outpatient evaluation if cardiology so deems it.  Repeat troponin noted to be negative and flat.  Chest x-ray without pneumothorax or pneumonia.  Doubt PE as patient is not hypoxic or tachycardic.  10:45 am Cardiology is at bedside.  They feel the patient can follow-up safely as an outpatient.  They will adjust his medications.  Patient and his daughter are agreeable to plan.  They were given return precautions.  After history, exam, and medical workup I feel the patient has been appropriately medically screened and is safe for discharge home. Pertinent diagnoses were discussed with the patient. Patient was given return precautions.  Final Clinical Impression(s) / ED Diagnoses Final diagnoses:  Precordial pain    Rx /  DC Orders ED Discharge Orders          Ordered    metoprolol tartrate (LOPRESSOR) 25 MG tablet  2 times daily        07/09/20 1045    aspirin EC 81 MG tablet  Daily        07/09/20 1045    nitroGLYCERIN (NITROSTAT) 0.4 MG SL tablet  Every 5 min PRN        07/09/20 1045           Lui Bellis, Mayer Masker, MD 07/09/20 1057

## 2020-07-09 NOTE — Discharge Instructions (Addendum)
You were seen today for chest pain.  Your medications will be adjusted per cardiology and you need to follow-up with cardiology office.  If you have recurrence or worsening of symptoms, you should be reevaluated.

## 2020-07-22 NOTE — Progress Notes (Signed)
Cardiology Clinic Note   Patient Name: Stephen Turner Date of Encounter: 07/23/2020  Primary Care Provider:  Leanna Battles, MD Primary Cardiologist:  Shelva Majestic, MD  Patient Profile    Stephen Turner 87 year old male presents the clinic today for follow-up evaluation of his precordial chest pain.  Past Medical History    Past Medical History:  Diagnosis Date  . Arthritis   . HLD (hyperlipidemia)   . HTN (hypertension)    Past Surgical History:  Procedure Laterality Date  . APPENDECTOMY    . CARPAL TUNNEL RELEASE Bilateral   . CATARACT EXTRACTION, BILATERAL    . TONSILLECTOMY      Allergies  Allergies  Allergen Reactions  . Metoprolol Swelling    STOPPED D/T GOUT REACTION IN BOTH THUMBS    History of Present Illness    [name] PMH of hypertension and hyperlipidemia.  He was seen by Dr. Claiborne Billings in the hospital on 07/09/2020.  Prior to the consult he had never been seen by a cardiologist.  He presents to the ED with chest pain.  He denied a significant family history of coronary artery disease.  He reported tobacco use and smoking cessation in the 1960s.  He continued to live independently and still drives.  He reported driving to see friends at friend's home last when he developed centralized chest pain and pressure.  He reported that on arrival to his friends place the pressure had worsened.  He returned home and took 2 sublingual nitroglycerin that he had been given by his PCP in the past.  He experienced some improvement with his symptoms.  He reported the incident to his son and they presented to the emergency department for further evaluation.  In the emergency department his creatinine was 1.04 and his high-sensitivity troponins were 4-6-6.  His CBC showed a WBC of 6.0, hemoglobin 15.3.  Chest x-ray was negative.  EKG showed normal sinus rhythm with PVCs and nonspecific changes with a rate of 68 bpm.  He reported no recurrent chest pain.  Aspirin, low-dose  metoprolol, and a new prescription for sublingual nitroglycerin were prescribed.  He was discharged in stable condition.  Who presents to clinic today for follow-up evaluation states he has had no further episodes of chest discomfort.  He is accompanied by his daughter who states that he is the primary caregiver for his wife.  His wife has had 2 CVAs and is wheelchair-bound.  He is required to do a good deal of lifting as well as all of her other daily activities.  He does have some help for about half hour and 1/2-hour evening.  At this time he feels like he can manage all of her care.  I recommended that he seek further assistance before it is required.  We used shared decision making and also talked about his metoprolol.  He reports that he had severe right hand redness stiffness and pain and stiffness in his left hand as well.  He presented to his PCP who recommended following up with [place] to talk about discontinuing metoprolol.  I will discontinue at this time and continue his aspirin.  His blood pressure is well controlled at home and he denies irregular heartbeats.  I will give him a salty 6 diet sheet, have him increase physical activity as tolerated, and have him follow-up in 6 months.  Today he denies chest pain, shortness of breath, lower extremity edema, fatigue, palpitations, melena, hematuria, hemoptysis, diaphoresis, weakness, presyncope, syncope, orthopnea, and PND.  Home Medications    Prior to Admission medications   Medication Sig Start Date End Date Taking? Authorizing Provider  amLODipine (NORVASC) 5 MG tablet Take 5 mg by mouth daily.    [provider]  aspirin EC 81 MG tablet Take 1 tablet (81 mg total) by mouth daily. Swallow whole. 07/09/20   Cheryln Manly, NP  gabapentin (NEURONTIN) 100 MG capsule Take 100 mg by mouth at bedtime.    [provider]  ibuprofen (ADVIL,MOTRIN) 200 MG tablet Take 400 mg by mouth in the morning and at bedtime.    [provider]  lovastatin (MEVACOR) 40 MG tablet Take 40 mg by mouth at bedtime.    [provider]  metoprolol tartrate (LOPRESSOR) 25 MG tablet Take 0.5 tablets (12.5 mg total) by mouth 2 (two) times daily. 07/09/20 11/06/20  Cheryln Manly, NP  nitroGLYCERIN (NITROSTAT) 0.4 MG SL tablet Place 1 tablet (0.4 mg total) under the tongue every 5 (five) minutes as needed. 07/09/20   Cheryln Manly, NP  Probiotic Product (PROBIOTIC DAILY PO) Take 1 capsule by mouth in the morning.    [provider]  triamcinolone cream (KENALOG) 0.1 % Apply 1 application topically in the morning, at noon, and at bedtime. 12/22/19   [provider]    Family History    Family History  Problem Relation Age of Onset  . Stroke Father   . Leukemia Brother   . Celiac disease Daughter   . COPD Son   . Prostate cancer Brother    He indicated that his mother is deceased. He indicated that his father is deceased. He indicated that both of his brothers are deceased. He indicated that his maternal grandmother is deceased. He indicated that his maternal grandfather is deceased. He indicated that his paternal grandmother is deceased. He indicated that his paternal grandfather is deceased. He indicated that his daughter is alive. He indicated that both of his sons are alive.  Social History    Social History   Socioeconomic History  . Marital status: Married    Spouse name: Not on file  . Number of children: 3  . Years of education: Not on file  . Highest education level: Not on file  Occupational History  . Occupation: retired  Tobacco Use  . Smoking status: Former Smoker    Packs/day: 0.50    Years: 20.00    Pack years: 10.00    Types: Cigarettes    Quit date: 1968    Years since quitting: 54.0  . Smokeless tobacco: Never Used  Vaping Use  . Vaping Use: Never used  Substance and Sexual Activity  . Alcohol use: Yes    Comment: 1 per week  . Drug use: No  . Sexual  activity: Never  Other Topics Concern  . Not on file  Social History Narrative  . Not on file   Social Determinants of Health   Financial Resource Strain: Not on file  Food Insecurity: Not on file  Transportation Needs: Not on file  Physical Activity: Not on file  Stress: Not on file  Social Connections: Not on file  Intimate Partner Violence: Not on file     Review of Systems    General:  No chills, fever, night sweats or weight changes.  Cardiovascular:  No chest pain, dyspnea on exertion, edema, orthopnea, palpitations, paroxysmal nocturnal dyspnea. Dermatological: No rash, lesions/masses Respiratory: No cough, dyspnea Urologic: No hematuria, dysuria Abdominal:   No nausea, vomiting,  diarrhea, bright red blood per rectum, melena, or hematemesis Neurologic:  No visual changes, wkns, changes in mental status. All other systems reviewed and are otherwise negative except as noted above.  Physical Exam    VS:  BP 124/72   Pulse 80   Wt 166 lb 3.2 oz (75.4 kg)   SpO2 96%   BMI 28.53 kg/m  , BMI Body mass index is 28.53 kg/m. GEN: Well nourished, well developed, in no acute distress. HEENT: normal. Neck: Supple, no JVD, carotid bruits, or masses. Cardiac: RRR, no murmurs, rubs, or gallops. No clubbing, cyanosis, edema.  Radials/DP/PT 2+ and equal bilaterally.  Respiratory:  Respirations regular and unlabored, clear to auscultation bilaterally. GI: Soft, nontender, nondistended, BS + x 4. MS: no deformity or atrophy. Skin: warm and dry, no rash. Neuro:  Strength and sensation are intact. Psych: Normal affect.  Accessory Clinical Findings    Recent Labs: 07/08/2020: BUN 33; Creatinine, Ser 1.04; Hemoglobin 15.3; Platelets 160; Potassium 4.1; Sodium 138   Recent Lipid Panel No results found for: CHOL, TRIG, HDL, CHOLHDL, VLDL, LDLCALC, LDLDIRECT  ECG personally reviewed by me today-none today.  EKG 07/09/2020 Normal sinus rhythm 68 bpm  Assessment & Plan   1.   Precordial chest pain- no chest pain today.  Recently seen and evaluated in the emergency department 07/09/2020.  High sensitive troponins negative x3.  EKG showed no acute ischemia.  Was prescribed aspirin, metoprolol, and given prescription for sublingual nitroglycerin.  No plans for further ischemic evaluation Continue aspirin, nitroglycerin as needed Heart healthy low-sodium diet-salty 6 given Increase physical activity as tolerated  Essential hypertension-BP today 124/72.  Well-controlled at home. Continue metoprolol Heart healthy low-sodium diet-salty 6 given Increase physical activity as tolerated  Hyperlipidemia-on lovastatin. Continue lovastatin, aspirin Heart healthy low-sodium high-fiber diet Increase physical activity as tolerated Follows with PCP  Disposition: Follow-up with Dr. Claiborne Billings or me in 6 months.    Jossie Ng. Liddie Chichester NP-C    07/23/2020, 2:52 PM [facility] [place] [address] Office [phone] Fax [phone]  Notice: This dictation was prepared with Dragon dictation along with smaller phrase technology. Any transcriptional errors that result from this process are unintentional and may not be corrected upon review.  I spent 20 minutes examining this patient, reviewing medications, and using patient centered shared decision making involving her cardiac care.  Prior to her visit I spent greater than 20 minutes reviewing her past medical history,  medications, and prior cardiac tests.

## 2020-07-23 ENCOUNTER — Other Ambulatory Visit: Payer: Self-pay

## 2020-07-23 ENCOUNTER — Encounter: Payer: Self-pay | Admitting: General Practice

## 2020-07-23 ENCOUNTER — Ambulatory Visit: Payer: Medicare Other | Admitting: General Practice

## 2020-07-23 VITALS — BP 124/72 | HR 80 | Wt 166.2 lb

## 2020-07-23 DIAGNOSIS — E785 Hyperlipidemia, unspecified: Secondary | ICD-10-CM | POA: Diagnosis not present

## 2020-07-23 DIAGNOSIS — R072 Precordial pain: Secondary | ICD-10-CM

## 2020-07-23 DIAGNOSIS — I10 Essential (primary) hypertension: Secondary | ICD-10-CM

## 2020-07-23 NOTE — Patient Instructions (Signed)
Medication Instructions:  The current medical regimen is effective;  continue present plan and medications as directed. Please refer to the Current Medication list given to you today.  *If you need a refill on your cardiac medications before your next appointment, please call your pharmacy*  Lab Work:   Testing/Procedures:  NONE    NONE  Special Instructions  PLEASE READ AND FOLLOW SALTY 6-ATTACHED-1,800mg  daily  PLEASE INCREASE PHYSICAL ACTIVITY AS TOLERATED  Follow-Up: Your next appointment:  6 month(s) In Person with Shelva Majestic, MD OR IF UNAVAILABLE JESSE CLEAVER, FNP-C   Please call our office 2 months in advance to schedule this appointment   At [facility], you and your health needs are our priority.  As part of our continuing mission to provide you with exceptional heart care, we have created designated Provider Care Teams.  These Care Teams include your primary Cardiologist (physician) and Advanced Practice Providers (APPs -  Physician Assistants and Nurse Practitioners) who all work together to provide you with the care you need, when you need it.            6 SALTY THINGS TO AVOID     1,800MG  DAILY

## 2020-07-24 ENCOUNTER — Telehealth: Payer: Self-pay | Admitting: Cardiovascular Disease
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
## 2020-07-24 NOTE — Telephone Encounter (Signed)
[name], patient's daughter, is calling to speak directly with [name] in regards to her father's OV yesterday with him. She states she has some additional information she would like to share with him that her father was not willing to share. She states she and her brother are very concerned about her father's health.

## 2020-07-24 NOTE — Telephone Encounter (Signed)
Discussed daughters concerns w/ [name], FNP-C recommend coordinating care with Primary MD.  Hulen Skains and spoke with Daughter, Lelon Frohlich reviewed concerns, cardiac issues and recommended she  Call PCP and get further assistance there.  Tried to call Dr Buel Ream office, office is closed today. Will call again later. [facility]. Also Dr Domenick Gong is Spouses PCP.

## 2020-07-25 NOTE — Telephone Encounter (Signed)
Mikalamb called back discussed daughter's message she will inform Dr Philip Aspen.

## 2021-01-03 ENCOUNTER — Other Ambulatory Visit (HOSPITAL_COMMUNITY): Payer: Self-pay | Admitting: Internal Medicine

## 2021-01-03 DIAGNOSIS — R188 Other ascites: Secondary | ICD-10-CM

## 2021-01-06 ENCOUNTER — Ambulatory Visit (HOSPITAL_COMMUNITY)
Admission: RE | Admit: 2021-01-06 | Discharge: 2021-01-06 | Disposition: A | Discharge status: Home / Self Care | Payer: Medicare Other | Source: Ambulatory Visit | Attending: Internal Medicine | Admitting: Internal Medicine

## 2021-01-06 ENCOUNTER — Other Ambulatory Visit: Payer: Self-pay

## 2021-01-06 DIAGNOSIS — R188 Other ascites: Secondary | ICD-10-CM | POA: Insufficient documentation

## 2021-01-06 DIAGNOSIS — C786 Secondary malignant neoplasm of retroperitoneum and peritoneum: Secondary | ICD-10-CM | POA: Diagnosis not present

## 2021-01-06 DIAGNOSIS — E871 Hypo-osmolality and hyponatremia: Secondary | ICD-10-CM | POA: Diagnosis not present

## 2021-01-06 HISTORY — PX: IR PARACENTESIS: IMG2679

## 2021-01-06 LAB — BODY FLUID CELL COUNT WITH DIFFERENTIAL
Eos, Fluid: 0 %
Lymphs, Fluid: 10 %
Monocyte-Macrophage-Serous Fluid: 2 % — ABNORMAL LOW (ref 50–90)
Neutrophil Count, Fluid: 88 % — ABNORMAL HIGH (ref 0–25)
Total Nucleated Cell Count, Fluid: UNDETERMINED cu mm (ref 0–1000)

## 2021-01-06 LAB — ALBUMIN, PLEURAL OR PERITONEAL FLUID: Albumin, Fluid: 1.6 g/dL

## 2021-01-06 MED ORDER — LIDOCAINE HCL 1 % IJ SOLN
INTRAMUSCULAR | Status: DC | PRN
  Administered 2021-01-06: 10 mL

## 2021-01-06 MED ORDER — LIDOCAINE HCL 1 % IJ SOLN
INTRAMUSCULAR | Status: AC
  Filled 2021-01-06: qty 20

## 2021-01-06 NOTE — Procedures (Signed)
PROCEDURE SUMMARY:  Successful US guided paracentesis from RLQ.  Yielded 4.2 L of hazy serosanguinous fluid.  No immediate complications.  Pt tolerated well.   Specimen was sent for labs.  EBL < 74mL  Ascencion Dike PA-C 01/06/2021 12:38 PM

## 2021-01-07 ENCOUNTER — Encounter (HOSPITAL_BASED_OUTPATIENT_CLINIC_OR_DEPARTMENT_OTHER): Payer: Self-pay | Admitting: Obstetrics and Gynecology

## 2021-01-07 ENCOUNTER — Inpatient Hospital Stay (HOSPITAL_BASED_OUTPATIENT_CLINIC_OR_DEPARTMENT_OTHER)
Admission: EM | Admit: 2021-01-07 | Discharge: 2021-02-10 | DRG: 374 | Disposition: E | Payer: Medicare Other | Attending: Internal Medicine | Admitting: Internal Medicine

## 2021-01-07 ENCOUNTER — Other Ambulatory Visit: Payer: Self-pay

## 2021-01-07 DIAGNOSIS — R54 Age-related physical debility: Secondary | ICD-10-CM | POA: Diagnosis present

## 2021-01-07 DIAGNOSIS — R188 Other ascites: Secondary | ICD-10-CM

## 2021-01-07 DIAGNOSIS — R180 Malignant ascites: Secondary | ICD-10-CM | POA: Diagnosis present

## 2021-01-07 DIAGNOSIS — C801 Malignant (primary) neoplasm, unspecified: Secondary | ICD-10-CM | POA: Diagnosis present

## 2021-01-07 DIAGNOSIS — E860 Dehydration: Secondary | ICD-10-CM | POA: Diagnosis present

## 2021-01-07 DIAGNOSIS — Z87891 Personal history of nicotine dependence: Secondary | ICD-10-CM

## 2021-01-07 DIAGNOSIS — N179 Acute kidney failure, unspecified: Secondary | ICD-10-CM

## 2021-01-07 DIAGNOSIS — Z888 Allergy status to other drugs, medicaments and biological substances status: Secondary | ICD-10-CM

## 2021-01-07 DIAGNOSIS — E875 Hyperkalemia: Secondary | ICD-10-CM | POA: Diagnosis present

## 2021-01-07 DIAGNOSIS — K219 Gastro-esophageal reflux disease without esophagitis: Secondary | ICD-10-CM | POA: Diagnosis present

## 2021-01-07 DIAGNOSIS — M1990 Unspecified osteoarthritis, unspecified site: Secondary | ICD-10-CM | POA: Diagnosis present

## 2021-01-07 DIAGNOSIS — Z79899 Other long term (current) drug therapy: Secondary | ICD-10-CM

## 2021-01-07 DIAGNOSIS — R5381 Other malaise: Secondary | ICD-10-CM | POA: Diagnosis present

## 2021-01-07 DIAGNOSIS — K652 Spontaneous bacterial peritonitis: Secondary | ICD-10-CM | POA: Diagnosis present

## 2021-01-07 DIAGNOSIS — E785 Hyperlipidemia, unspecified: Secondary | ICD-10-CM | POA: Diagnosis present

## 2021-01-07 DIAGNOSIS — Z66 Do not resuscitate: Secondary | ICD-10-CM | POA: Diagnosis present

## 2021-01-07 DIAGNOSIS — Z20822 Contact with and (suspected) exposure to covid-19: Secondary | ICD-10-CM | POA: Diagnosis present

## 2021-01-07 DIAGNOSIS — R630 Anorexia: Secondary | ICD-10-CM | POA: Diagnosis present

## 2021-01-07 DIAGNOSIS — R52 Pain, unspecified: Secondary | ICD-10-CM

## 2021-01-07 DIAGNOSIS — R627 Adult failure to thrive: Secondary | ICD-10-CM | POA: Diagnosis present

## 2021-01-07 DIAGNOSIS — Z7982 Long term (current) use of aspirin: Secondary | ICD-10-CM

## 2021-01-07 DIAGNOSIS — E871 Hypo-osmolality and hyponatremia: Secondary | ICD-10-CM

## 2021-01-07 DIAGNOSIS — R109 Unspecified abdominal pain: Secondary | ICD-10-CM | POA: Diagnosis present

## 2021-01-07 DIAGNOSIS — I10 Essential (primary) hypertension: Secondary | ICD-10-CM | POA: Diagnosis present

## 2021-01-07 DIAGNOSIS — G4700 Insomnia, unspecified: Secondary | ICD-10-CM | POA: Diagnosis present

## 2021-01-07 DIAGNOSIS — C786 Secondary malignant neoplasm of retroperitoneum and peritoneum: Principal | ICD-10-CM

## 2021-01-07 DIAGNOSIS — R0602 Shortness of breath: Secondary | ICD-10-CM

## 2021-01-07 DIAGNOSIS — Z6827 Body mass index (BMI) 27.0-27.9, adult: Secondary | ICD-10-CM

## 2021-01-07 DIAGNOSIS — Z515 Encounter for palliative care: Secondary | ICD-10-CM

## 2021-01-07 DIAGNOSIS — E872 Acidosis: Secondary | ICD-10-CM | POA: Diagnosis present

## 2021-01-07 DIAGNOSIS — E079 Disorder of thyroid, unspecified: Secondary | ICD-10-CM

## 2021-01-07 LAB — CBC WITH DIFFERENTIAL/PLATELET
Abs Immature Granulocytes: 0.21 10*3/uL — ABNORMAL HIGH (ref 0.00–0.07)
Basophils Absolute: 0.1 10*3/uL (ref 0.0–0.1)
Basophils Relative: 1 %
Eosinophils Absolute: 0.1 10*3/uL (ref 0.0–0.5)
Eosinophils Relative: 1 %
HCT: 51.5 % (ref 39.0–52.0)
Hemoglobin: 17.4 g/dL — ABNORMAL HIGH (ref 13.0–17.0)
Immature Granulocytes: 2 %
Lymphocytes Relative: 5 %
Lymphs Abs: 0.5 10*3/uL — ABNORMAL LOW (ref 0.7–4.0)
MCH: 30.5 pg (ref 26.0–34.0)
MCHC: 33.8 g/dL (ref 30.0–36.0)
MCV: 90.2 fL (ref 80.0–100.0)
Monocytes Absolute: 1.3 10*3/uL — ABNORMAL HIGH (ref 0.1–1.0)
Monocytes Relative: 11 %
Neutro Abs: 9.5 10*3/uL — ABNORMAL HIGH (ref 1.7–7.7)
Neutrophils Relative %: 80 %
Platelets: 408 10*3/uL — ABNORMAL HIGH (ref 150–400)
RBC: 5.71 MIL/uL (ref 4.22–5.81)
RDW: 12.6 % (ref 11.5–15.5)
WBC: 11.7 10*3/uL — ABNORMAL HIGH (ref 4.0–10.5)
nRBC: 0 % (ref 0.0–0.2)

## 2021-01-07 LAB — PH, BODY FLUID: pH, Body Fluid: 7.1

## 2021-01-07 LAB — COMPREHENSIVE METABOLIC PANEL
ALT: 25 U/L (ref 0–44)
AST: 32 U/L (ref 15–41)
Albumin: 3.2 g/dL — ABNORMAL LOW (ref 3.5–5.0)
Alkaline Phosphatase: 92 U/L (ref 38–126)
Anion gap: 14 (ref 5–15)
BUN: 73 mg/dL — ABNORMAL HIGH (ref 8–23)
CO2: 18 mmol/L — ABNORMAL LOW (ref 22–32)
Calcium: 9 mg/dL (ref 8.9–10.3)
Chloride: 95 mmol/L — ABNORMAL LOW (ref 98–111)
Creatinine, Ser: 1.46 mg/dL — ABNORMAL HIGH (ref 0.61–1.24)
GFR, Estimated: 44 mL/min — ABNORMAL LOW (ref >60)
Glucose, Bld: 142 mg/dL — ABNORMAL HIGH (ref 70–99)
Potassium: 5.3 mmol/L — ABNORMAL HIGH (ref 3.5–5.1)
Sodium: 127 mmol/L — ABNORMAL LOW (ref 135–145)
Total Bilirubin: 0.5 mg/dL (ref 0.3–1.2)
Total Protein: 6 g/dL — ABNORMAL LOW (ref 6.5–8.1)

## 2021-01-07 LAB — LACTIC ACID, PLASMA: Lactic Acid, Venous: 2.2 mmol/L (ref 0.5–1.9)

## 2021-01-07 NOTE — ED Triage Notes (Signed)
Patient reports to the ER after a paracentesis yesterday. The fluid from the paracentesis had WBC's in it and was bloody and turbid and the son was told to come to the ER to R/o infection

## 2021-01-07 NOTE — ED Notes (Signed)
MD Zackowksi made aware of patients elevated Lactic Acid; no new orders at this time.

## 2021-01-08 ENCOUNTER — Emergency Department (HOSPITAL_BASED_OUTPATIENT_CLINIC_OR_DEPARTMENT_OTHER): Payer: Medicare Other

## 2021-01-08 ENCOUNTER — Encounter (HOSPITAL_BASED_OUTPATIENT_CLINIC_OR_DEPARTMENT_OTHER): Payer: Self-pay | Admitting: Radiology

## 2021-01-08 DIAGNOSIS — E872 Acidosis: Secondary | ICD-10-CM | POA: Diagnosis present

## 2021-01-08 DIAGNOSIS — E875 Hyperkalemia: Secondary | ICD-10-CM | POA: Diagnosis present

## 2021-01-08 DIAGNOSIS — G4700 Insomnia, unspecified: Secondary | ICD-10-CM | POA: Diagnosis present

## 2021-01-08 DIAGNOSIS — R630 Anorexia: Secondary | ICD-10-CM | POA: Diagnosis present

## 2021-01-08 DIAGNOSIS — Z87891 Personal history of nicotine dependence: Secondary | ICD-10-CM | POA: Diagnosis not present

## 2021-01-08 DIAGNOSIS — R180 Malignant ascites: Secondary | ICD-10-CM | POA: Diagnosis present

## 2021-01-08 DIAGNOSIS — M1990 Unspecified osteoarthritis, unspecified site: Secondary | ICD-10-CM | POA: Diagnosis present

## 2021-01-08 DIAGNOSIS — Z7189 Other specified counseling: Secondary | ICD-10-CM | POA: Diagnosis not present

## 2021-01-08 DIAGNOSIS — R54 Age-related physical debility: Secondary | ICD-10-CM | POA: Diagnosis present

## 2021-01-08 DIAGNOSIS — K7011 Alcoholic hepatitis with ascites: Secondary | ICD-10-CM | POA: Diagnosis not present

## 2021-01-08 DIAGNOSIS — E785 Hyperlipidemia, unspecified: Secondary | ICD-10-CM | POA: Diagnosis present

## 2021-01-08 DIAGNOSIS — Z515 Encounter for palliative care: Secondary | ICD-10-CM | POA: Diagnosis not present

## 2021-01-08 DIAGNOSIS — C786 Secondary malignant neoplasm of retroperitoneum and peritoneum: Secondary | ICD-10-CM | POA: Diagnosis present

## 2021-01-08 DIAGNOSIS — K219 Gastro-esophageal reflux disease without esophagitis: Secondary | ICD-10-CM | POA: Diagnosis present

## 2021-01-08 DIAGNOSIS — E860 Dehydration: Secondary | ICD-10-CM | POA: Diagnosis present

## 2021-01-08 DIAGNOSIS — R188 Other ascites: Secondary | ICD-10-CM | POA: Diagnosis not present

## 2021-01-08 DIAGNOSIS — R627 Adult failure to thrive: Secondary | ICD-10-CM | POA: Diagnosis present

## 2021-01-08 DIAGNOSIS — R1010 Upper abdominal pain, unspecified: Secondary | ICD-10-CM | POA: Diagnosis not present

## 2021-01-08 DIAGNOSIS — R109 Unspecified abdominal pain: Secondary | ICD-10-CM | POA: Diagnosis present

## 2021-01-08 DIAGNOSIS — C801 Malignant (primary) neoplasm, unspecified: Secondary | ICD-10-CM | POA: Diagnosis present

## 2021-01-08 DIAGNOSIS — Z7982 Long term (current) use of aspirin: Secondary | ICD-10-CM | POA: Diagnosis not present

## 2021-01-08 DIAGNOSIS — K652 Spontaneous bacterial peritonitis: Secondary | ICD-10-CM

## 2021-01-08 DIAGNOSIS — R5381 Other malaise: Secondary | ICD-10-CM | POA: Diagnosis present

## 2021-01-08 DIAGNOSIS — I10 Essential (primary) hypertension: Secondary | ICD-10-CM | POA: Diagnosis present

## 2021-01-08 DIAGNOSIS — E079 Disorder of thyroid, unspecified: Secondary | ICD-10-CM | POA: Diagnosis present

## 2021-01-08 DIAGNOSIS — R1011 Right upper quadrant pain: Secondary | ICD-10-CM | POA: Diagnosis not present

## 2021-01-08 DIAGNOSIS — E871 Hypo-osmolality and hyponatremia: Secondary | ICD-10-CM | POA: Diagnosis present

## 2021-01-08 DIAGNOSIS — Z888 Allergy status to other drugs, medicaments and biological substances status: Secondary | ICD-10-CM | POA: Diagnosis not present

## 2021-01-08 DIAGNOSIS — Z20822 Contact with and (suspected) exposure to covid-19: Secondary | ICD-10-CM | POA: Diagnosis present

## 2021-01-08 DIAGNOSIS — N179 Acute kidney failure, unspecified: Secondary | ICD-10-CM | POA: Diagnosis present

## 2021-01-08 DIAGNOSIS — Z66 Do not resuscitate: Secondary | ICD-10-CM | POA: Diagnosis present

## 2021-01-08 LAB — URINALYSIS, ROUTINE W REFLEX MICROSCOPIC
Bilirubin Urine: NEGATIVE
Glucose, UA: NEGATIVE mg/dL
Hgb urine dipstick: NEGATIVE
Ketones, ur: 15 mg/dL — AB
Leukocytes,Ua: NEGATIVE
Nitrite: NEGATIVE
Protein, ur: 30 mg/dL — AB
Specific Gravity, Urine: >1.046 — ABNORMAL HIGH (ref 1.005–1.030)
pH: 6 (ref 5.0–8.0)

## 2021-01-08 LAB — RESP PANEL BY RT-PCR (FLU A&B, COVID) ARPGX2
Influenza A by PCR: NEGATIVE
Influenza B by PCR: NEGATIVE
SARS Coronavirus 2 by RT PCR: NEGATIVE

## 2021-01-08 LAB — ACID FAST SMEAR (AFB, MYCOBACTERIA): Acid Fast Smear: NEGATIVE

## 2021-01-08 LAB — LACTIC ACID, PLASMA: Lactic Acid, Venous: 2.3 mmol/L (ref 0.5–1.9)

## 2021-01-08 MED ORDER — IOHEXOL 300 MG/ML  SOLN
100.0000 mL | Freq: Once | INTRAMUSCULAR | Status: AC | PRN
  Administered 2021-01-08: 75 mL via INTRAVENOUS

## 2021-01-08 MED ORDER — AMLODIPINE BESYLATE 5 MG PO TABS
5.0000 mg | ORAL_TABLET | Freq: Every day | ORAL | Status: DC
  Administered 2021-01-09 – 2021-01-11 (×2): 5 mg via ORAL
  Filled 2021-01-08 (×4): qty 1

## 2021-01-08 MED ORDER — SODIUM CHLORIDE 0.9 % IV SOLN
1.0000 g | INTRAVENOUS | Status: DC
  Administered 2021-01-08: 1 g via INTRAVENOUS
  Filled 2021-01-08: qty 10

## 2021-01-08 MED ORDER — NITROGLYCERIN 0.4 MG SL SUBL
0.4000 mg | SUBLINGUAL_TABLET | SUBLINGUAL | Status: DC | PRN
  Administered 2021-01-12 (×2): 0.4 mg via SUBLINGUAL
  Filled 2021-01-08 (×2): qty 1

## 2021-01-08 MED ORDER — SODIUM CHLORIDE 0.9 % IV SOLN
2.0000 g | Freq: Every day | INTRAVENOUS | Status: DC
  Administered 2021-01-08 – 2021-01-12 (×5): 2 g via INTRAVENOUS
  Filled 2021-01-08 (×2): qty 2
  Filled 2021-01-08 (×3): qty 20
  Filled 2021-01-08: qty 2

## 2021-01-08 MED ORDER — ENOXAPARIN SODIUM 40 MG/0.4ML IJ SOSY
40.0000 mg | PREFILLED_SYRINGE | INTRAMUSCULAR | Status: DC
  Administered 2021-01-08: 40 mg via SUBCUTANEOUS
  Filled 2021-01-08: qty 0.4

## 2021-01-08 MED ORDER — FAMOTIDINE IN NACL 20-0.9 MG/50ML-% IV SOLN
20.0000 mg | Freq: Once | INTRAVENOUS | Status: AC
  Administered 2021-01-08: 20 mg via INTRAVENOUS
  Filled 2021-01-08: qty 50

## 2021-01-08 MED ORDER — ASPIRIN EC 81 MG PO TBEC
81.0000 mg | DELAYED_RELEASE_TABLET | Freq: Every day | ORAL | Status: DC
  Administered 2021-01-09 – 2021-01-12 (×4): 81 mg via ORAL
  Filled 2021-01-08 (×4): qty 1

## 2021-01-08 MED ORDER — PRAVASTATIN SODIUM 20 MG PO TABS
40.0000 mg | ORAL_TABLET | Freq: Every day | ORAL | Status: DC
  Administered 2021-01-08 – 2021-01-11 (×4): 40 mg via ORAL
  Filled 2021-01-08 (×5): qty 2

## 2021-01-08 MED ORDER — SODIUM CHLORIDE 0.9 % IV SOLN: INTRAVENOUS | Status: DC

## 2021-01-08 MED ORDER — ACETAMINOPHEN 500 MG PO TABS
1000.0000 mg | ORAL_TABLET | Freq: Once | ORAL | Status: DC
  Filled 2021-01-08: qty 2

## 2021-01-08 MED ORDER — ONDANSETRON HCL 4 MG/2ML IJ SOLN
4.0000 mg | Freq: Four times a day (QID) | INTRAMUSCULAR | Status: DC | PRN
  Administered 2021-01-09 – 2021-01-12 (×2): 4 mg via INTRAVENOUS
  Filled 2021-01-08 (×2): qty 2

## 2021-01-08 MED ORDER — ONDANSETRON HCL 4 MG PO TABS: 4.0000 mg | ORAL_TABLET | Freq: Four times a day (QID) | ORAL | Status: DC | PRN

## 2021-01-08 MED ORDER — TRAMADOL HCL 50 MG PO TABS
50.0000 mg | ORAL_TABLET | Freq: Three times a day (TID) | ORAL | Status: DC | PRN
  Administered 2021-01-11 – 2021-01-12 (×2): 50 mg via ORAL
  Filled 2021-01-08 (×2): qty 1

## 2021-01-08 MED ORDER — ALUM & MAG HYDROXIDE-SIMETH 200-200-20 MG/5ML PO SUSP
30.0000 mL | Freq: Once | ORAL | Status: AC
  Administered 2021-01-08: 30 mL via ORAL
  Filled 2021-01-08 (×2): qty 30

## 2021-01-08 NOTE — ED Notes (Signed)
Visually checked on Pt. Both son and Pt were asleep in room. Pt's breathing pattern was normal

## 2021-01-08 NOTE — ED Provider Notes (Addendum)
[facility] DEPT Provider Note   CSN: [id number] Arrival date & time: 12/29/2020  2025     History Chief Complaint  Patient presents with   Abdominal Pain    Stephen Turner is a 87 y.o. male.  The history is provided by the patient and a relative.  Illness Quality:  Feeling poorly, decreased oral intake. Severity:  Moderate Onset quality:  Gradual Timing:  Constant Progression:  Worsening Chronicity:  New Context:  Pleasant 87 year old male patient with HTN, hyperlipidemia and GERD who presents having not been feeling well and had abdominal swelling.  An outpatient paracentesis was ordered and reportedly great than 4 liters were drawn off.  Patient has continued to feel badly.  Patient was contacted and told white cell count on fluid was high and to go to the ER for evaluation.Patient does not have a history of cirrhosis, nor CHF.  No previous ascites. Relieved by:  Nothing Worsened by:  Time Ineffective treatments:  Paracentesis Associated symptoms: abdominal pain   Associated symptoms: no fever, no rash and no wheezing       Past Medical History:  Diagnosis Date   Arthritis    HLD (hyperlipidemia)    HTN (hypertension)     Patient Active Problem List   Diagnosis Date Noted   Precordial pain    Angina pectoris ([place])    Hyperlipidemia with target low density lipoprotein (LDL) cholesterol less than 70 mg/dL    Hoarseness 01/04/2020   Throat clearing 01/04/2020   Gastroesophageal reflux disease 01/04/2020   Essential hypertension 11/01/2007   PERSONAL HISTORY OF COLONIC POLYPS 11/01/2007    Past Surgical History:  Procedure Laterality Date   APPENDECTOMY     CARPAL TUNNEL RELEASE Bilateral    CATARACT EXTRACTION, BILATERAL     IR PARACENTESIS  01/06/2021   TONSILLECTOMY         Family History  Problem Relation Age of Onset   Stroke Father    Leukemia Brother    Celiac disease Daughter    COPD Son    Prostate cancer Brother      Social History   Tobacco Use   Smoking status: Former    Packs/day: 0.50    Years: 20.00    Pack years: 10.00    Types: Cigarettes    Quit date: 1968    Years since quitting: 54.5   Smokeless tobacco: Never  Vaping Use   Vaping Use: Never used  Substance Use Topics   Alcohol use: Yes    Comment: 1 per week   Drug use: No    Home Medications Prior to Admission medications   Medication Sig Start Date End Date Taking? Authorizing Provider  amLODipine (NORVASC) 5 MG tablet Take 5 mg by mouth daily.    [provider]  aspirin EC 81 MG tablet Take 1 tablet (81 mg total) by mouth daily. Swallow whole. 07/09/20   Cheryln Manly, NP  gabapentin (NEURONTIN) 100 MG capsule Take 100 mg by mouth at bedtime.    [provider]  ibuprofen (ADVIL,MOTRIN) 200 MG tablet Take 400 mg by mouth in the morning and at bedtime.    [provider]  lovastatin (MEVACOR) 40 MG tablet Take 40 mg by mouth at bedtime.    [provider]  nitroGLYCERIN (NITROSTAT) 0.4 MG SL tablet Place 1 tablet (0.4 mg total) under the tongue every 5 (five) minutes as needed. 07/09/20   Cheryln Manly, NP  Probiotic Product (PROBIOTIC DAILY  PO) Take 1 capsule by mouth in the morning.    [provider]  triamcinolone cream (KENALOG) 0.1 % Apply 1 application topically in the morning, at noon, and at bedtime. 12/22/19   [provider]    Allergies    Metoprolol  Review of Systems   Review of Systems  Constitutional:  Positive for appetite change. Negative for fever.  HENT:  Negative for drooling.   Eyes:  Negative for redness.  Respiratory:  Negative for wheezing.   Cardiovascular:  Negative for leg swelling.  Gastrointestinal:  Positive for abdominal distention and abdominal pain.  Genitourinary:  Positive for decreased urine volume.  Musculoskeletal:  Negative for neck stiffness.  Skin:  Negative for rash.  Neurological:  Negative for facial  asymmetry.  Psychiatric/Behavioral:  Negative for agitation.   All other systems reviewed and are negative.  Physical Exam Updated Vital Signs BP 123/78 (BP Location: Right Arm)   Pulse 86   Temp 98.2 F (36.8 C) (Oral)   Resp 15   Ht 5\' 4"  (1.626 m)   SpO2 96%   BMI 28.53 kg/m   Physical Exam Vitals and nursing note reviewed. Exam conducted with a chaperone present.  Constitutional:      Appearance: He is well-developed. He is not diaphoretic.  HENT:     Head: Normocephalic and atraumatic.     Nose: Nose normal.  Eyes:     Extraocular Movements: Extraocular movements intact.     Pupils: Pupils are equal, round, and reactive to light.  Cardiovascular:     Rate and Rhythm: Normal rate and regular rhythm.     Pulses: Normal pulses.     Heart sounds: Normal heart sounds.  Pulmonary:     Effort: Pulmonary effort is normal.     Breath sounds: Normal breath sounds. No stridor. No wheezing or rales.  Abdominal:     General: Bowel sounds are normal.     Palpations: Abdomen is soft.     Tenderness: There is no guarding or rebound.     Hernia: No hernia is present.  Musculoskeletal:     Cervical back: Normal range of motion and neck supple.     Right lower leg: No edema.     Left lower leg: No edema.  Skin:    General: Skin is warm and dry.     Capillary Refill: Capillary refill takes less than 2 seconds.  Neurological:     General: No focal deficit present.     Mental Status: He is alert and oriented to person, place, and time.     Deep Tendon Reflexes: Reflexes normal.  Psychiatric:        Mood and Affect: Mood normal.        Behavior: Behavior normal.    ED Results / Procedures / Treatments   Labs (all labs ordered are listed, but only abnormal results are displayed) Results for orders placed or performed during the hospital encounter of 12/30/2020  Lactic acid, plasma  Result Value Ref Range   Lactic Acid, Venous 2.2 (HH) 0.5 - 1.9 mmol/L  Lactic acid, plasma   Result Value Ref Range   Lactic Acid, Venous 2.3 (HH) 0.5 - 1.9 mmol/L  Comprehensive metabolic panel  Result Value Ref Range   Sodium 127 (L) 135 - 145 mmol/L   Potassium 5.3 (H) 3.5 - 5.1 mmol/L   Chloride 95 (L) 98 - 111 mmol/L   CO2 18 (L) 22 - 32 mmol/L   Glucose, Bld 142 (  H) 70 - 99 mg/dL   BUN 73 (H) 8 - 23 mg/dL   Creatinine, Ser 1.46 (H) 0.61 - 1.24 mg/dL   Calcium 9.0 8.9 - 10.3 mg/dL   Total Protein 6.0 (L) 6.5 - 8.1 g/dL   Albumin 3.2 (L) 3.5 - 5.0 g/dL   AST 32 15 - 41 U/L   ALT 25 0 - 44 U/L   Alkaline Phosphatase 92 38 - 126 U/L   Total Bilirubin 0.5 0.3 - 1.2 mg/dL   GFR, Estimated 44 (L) >60 mL/min   Anion gap 14 5 - 15  CBC with Differential  Result Value Ref Range   WBC 11.7 (H) 4.0 - 10.5 K/uL   RBC 5.71 4.22 - 5.81 MIL/uL   Hemoglobin 17.4 (H) 13.0 - 17.0 g/dL   HCT 51.5 39.0 - 52.0 %   MCV 90.2 80.0 - 100.0 fL   MCH 30.5 26.0 - 34.0 pg   MCHC 33.8 30.0 - 36.0 g/dL   RDW 12.6 11.5 - 15.5 %   Platelets 408 (H) 150 - 400 K/uL   nRBC 0.0 0.0 - 0.2 %   Neutrophils Relative % 80 %   Neutro Abs 9.5 (H) 1.7 - 7.7 K/uL   Lymphocytes Relative 5 %   Lymphs Abs 0.5 (L) 0.7 - 4.0 K/uL   Monocytes Relative 11 %   Monocytes Absolute 1.3 (H) 0.1 - 1.0 K/uL   Eosinophils Relative 1 %   Eosinophils Absolute 0.1 0.0 - 0.5 K/uL   Basophils Relative 1 %   Basophils Absolute 0.1 0.0 - 0.1 K/uL   Immature Granulocytes 2 %   Abs Immature Granulocytes 0.21 (H) 0.00 - 0.07 K/uL   DG Chest 1 View  Result Date: 01/08/2021 CLINICAL DATA:  Peritonitis, leukocytosis EXAM: CHEST  1 VIEW COMPARISON:  07/09/2020 FINDINGS: Lungs are well expanded, symmetric, and clear. No pneumothorax or pleural effusion. Cardiac size within normal limits. Pulmonary vascularity is normal. Osseous structures are age-appropriate. No acute bone abnormality. IMPRESSION: No active disease. Electronically Signed   By: Fidela Salisbury MD   On: 01/08/2021 00:27   CT ABDOMEN PELVIS W CONTRAST  Result  Date: 01/08/2021 CLINICAL DATA:  Abdominal pain.  Recent paracentesis EXAM: CT ABDOMEN AND PELVIS WITH CONTRAST TECHNIQUE: Multidetector CT imaging of the abdomen and pelvis was performed using the standard protocol following bolus administration of intravenous contrast. CONTRAST:  9mL OMNIPAQUE IOHEXOL 300 MG/ML  SOLN COMPARISON:  None. FINDINGS: Lower chest: No acute abnormality. Hepatobiliary: Small cyst in the inferior right hepatic lobe. No suspicious focal hepatic abnormality. Gallbladder unremarkable. Pancreas: No focal abnormality or ductal dilatation. Spleen: No focal abnormality.  Normal size. Adrenals/Urinary Tract: Left renal parapelvic cysts. No hydronephrosis. No renal or adrenal mass. Small diverticulum off the posterior left bladder wall. No bladder wall thickening. Stomach/Bowel: Scattered colonic diverticulosis. No bowel obstruction. Stomach and small bowel decompressed, grossly unremarkable. Vascular/Lymphatic: Calcified aorta. No evidence of aneurysm or adenopathy. Reproductive: Mildly prominent prostate. Other: Moderate ascites throughout the abdomen and pelvis. There is omental thickening/caking. Peritoneal mass seen in the left upper quadrant adjacent to the spleen measuring up to 7 cm. Other peritoneal masses adjacent to the splenic flexure of the colon measuring 3.2 cm. Musculoskeletal: No acute bony abnormality. IMPRESSION: Moderate ascites with omental thickening/caking and multiple peritoneal cystic masses. 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 Aortic atherosclerosis. Electronically Signed   By: Rolm Baptise M.D.   On: 01/08/2021 00:47   IR Paracentesis  Result Date: 01/06/2021 INDICATION: Abdominal distention  with ascites. Request for therapeutic and diagnostic paracentesis. EXAM: ULTRASOUND GUIDED RIGHT LOWER QUADRANT PARACENTESIS MEDICATIONS: % plain lidocaine, 5 mL COMPLICATIONS: None immediate. PROCEDURE: Informed written consent was obtained from the patient  after a discussion of the risks, benefits and alternatives to treatment. A timeout was performed prior to the initiation of the procedure. Initial ultrasound scanning demonstrates a large amount of ascites within the right lower abdominal quadrant. The right lower abdomen was prepped and draped in the usual sterile fashion. 1% lidocaine was used for local anesthesia. Following this, a 6 Fr Safe-T-Centesis catheter was introduced. An ultrasound image was saved for documentation purposes. The paracentesis was performed. The catheter was removed and a dressing was applied. The patient tolerated the procedure well without immediate post procedural complication. FINDINGS: A total of approximately 4.2 L of hazy, serosanguineous fluid was removed. Samples were sent to the laboratory as requested by the clinical team. IMPRESSION: Successful ultrasound-guided paracentesis yielding 4.2 liters of peritoneal fluid. Read by: Ascencion Dike PA-C Electronically Signed   By: Ruthann Cancer MD   On: 01/06/2021 12:39     Radiology DG Chest 1 View  Result Date: 01/08/2021 CLINICAL DATA:  Peritonitis, leukocytosis EXAM: CHEST  1 VIEW COMPARISON:  07/09/2020 FINDINGS: Lungs are well expanded, symmetric, and clear. No pneumothorax or pleural effusion. Cardiac size within normal limits. Pulmonary vascularity is normal. Osseous structures are age-appropriate. No acute bone abnormality. IMPRESSION: No active disease. Electronically Signed   By: Fidela Salisbury MD   On: 01/08/2021 00:27   CT ABDOMEN PELVIS W CONTRAST  Result Date: 01/08/2021 CLINICAL DATA:  Abdominal pain.  Recent paracentesis EXAM: CT ABDOMEN AND PELVIS WITH CONTRAST TECHNIQUE: Multidetector CT imaging of the abdomen and pelvis was performed using the standard protocol following bolus administration of intravenous contrast. CONTRAST:  16mL OMNIPAQUE IOHEXOL 300 MG/ML  SOLN COMPARISON:  None. FINDINGS: Lower chest: No acute abnormality. Hepatobiliary: Small cyst in  the inferior right hepatic lobe. No suspicious focal hepatic abnormality. Gallbladder unremarkable. Pancreas: No focal abnormality or ductal dilatation. Spleen: No focal abnormality.  Normal size. Adrenals/Urinary Tract: Left renal parapelvic cysts. No hydronephrosis. No renal or adrenal mass. Small diverticulum off the posterior left bladder wall. No bladder wall thickening. Stomach/Bowel: Scattered colonic diverticulosis. No bowel obstruction. Stomach and small bowel decompressed, grossly unremarkable. Vascular/Lymphatic: Calcified aorta. No evidence of aneurysm or adenopathy. Reproductive: Mildly prominent prostate. Other: Moderate ascites throughout the abdomen and pelvis. There is omental thickening/caking. Peritoneal mass seen in the left upper quadrant adjacent to the spleen measuring up to 7 cm. Other peritoneal masses adjacent to the splenic flexure of the colon measuring 3.2 cm. Musculoskeletal: No acute bony abnormality. IMPRESSION: Moderate ascites with omental thickening/caking and multiple peritoneal cystic masses. 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 Aortic atherosclerosis. Electronically Signed   By: Rolm Baptise M.D.   On: 01/08/2021 00:47   IR Paracentesis  Result Date: 01/06/2021 INDICATION: Abdominal distention with ascites. Request for therapeutic and diagnostic paracentesis. EXAM: ULTRASOUND GUIDED RIGHT LOWER QUADRANT PARACENTESIS MEDICATIONS: % plain lidocaine, 5 mL COMPLICATIONS: None immediate. PROCEDURE: Informed written consent was obtained from the patient after a discussion of the risks, benefits and alternatives to treatment. A timeout was performed prior to the initiation of the procedure. Initial ultrasound scanning demonstrates a large amount of ascites within the right lower abdominal quadrant. The right lower abdomen was prepped and draped in the usual sterile fashion. 1% lidocaine was used for local anesthesia. Following this, a 6 Fr Safe-T-Centesis  catheter was introduced.  An ultrasound image was saved for documentation purposes. The paracentesis was performed. The catheter was removed and a dressing was applied. The patient tolerated the procedure well without immediate post procedural complication. FINDINGS: A total of approximately 4.2 L of hazy, serosanguineous fluid was removed. Samples were sent to the laboratory as requested by the clinical team. IMPRESSION: Successful ultrasound-guided paracentesis yielding 4.2 liters of peritoneal fluid. Read by: Ascencion Dike PA-C Electronically Signed   By: Ruthann Cancer MD   On: 01/06/2021 12:39    Procedures Procedures   Medications Ordered in ED Medications  cefTRIAXone (ROCEPHIN) 1 g in sodium chloride 0.9 % 100 mL IVPB (0 g Intravenous Stopped 01/08/21 0332)  famotidine (PEPCID) IVPB 20 mg premix (20 mg Intravenous New Bag/Given 01/08/21 0550)  acetaminophen (TYLENOL) tablet 1,000 mg (has no administration in time range)  iohexol (OMNIPAQUE) 300 MG/ML solution 100 mL (75 mLs Intravenous Contrast Given 01/08/21 0032)  alum & mag hydroxide-simeth (MAALOX/MYLANTA) 200-200-20 MG/5ML suspension 30 mL (30 mLs Oral Given 01/08/21 0544)     ED Course  I have reviewed the triage vital signs and the nursing notes.  Pertinent labs & imaging results that were available during my care of the patient were reviewed by me and considered in my medical decision making (see chart for details).   I have reviewed lactate ordered prior to my shift.  I do not believe this patient is septic at this time. There are multiple additional reasons for the elevation.  I am hydrating the patient slowly  based on elevation of his BUN and creatinine, hemoconcentration and poor oral intake. I will do this slowly in order to attempt to prevent reaccummulation in the abdominal cavity, as there is still some ascites on CT scan this evening.  I am admitting the patient for concern of spontaneous bacterial peritonitis, there was no  gram stain performed on the ascites. IV antibiotics to treat SBP initiated in the ED.  I will admit the patient for ongoing rule out of SBP and further work up of carcinomatosis seen on CT.    245 am: Case discussed with Dr. Cyd Silence of [facility] who will accept the patient for admission   540 am:  patient requested medication for acid reflux.  GI cocktail and IV pepcid ordered immediately.    Final Clinical Impression(s) / ED Diagnoses Ascites 2. Hyponatremia  Plan: Admit to medicine             Kalob Bergen, MD 01/08/21 [id number]

## 2021-01-08 NOTE — ED Notes (Signed)
Visually checked on Pt. Pt was resting with normal breathing pattern. Pt family at bedside and stated that Pt was doing well.

## 2021-01-08 NOTE — ED Notes (Signed)
Report Called to [facility]

## 2021-01-08 NOTE — ED Notes (Signed)
MD Palumbo made aware of patients elevated Lactic Acid of 2.3; no new orders at this time.

## 2021-01-08 NOTE — ED Notes (Signed)
Dr.was very thorough with patient and family as to plan of care. She went through all the labs, scans, and plan of care.

## 2021-01-08 NOTE — Consult Note (Signed)
[place] [phone]   Fax:([phone] [place] NOTE  Patient Care Team: Leanna Battles, MD as PCP - General (Internal Medicine) Troy Sine, MD as PCP - Cardiology (Cardiology)  Hematological/Oncological History # Peritoneal Carcinomatosis, Unclear Primary 01/08/2021: presented to the Emergency department with poor PO intake and abdominal distension. CT A/P showed moderate ascites and carcinomatosis with involvement of the mesentery.  01/08/2021: establish care with Dr. Lorenso Courier  CHIEF COMPLAINTS/PURPOSE OF CONSULTATION:  "Peritoneal Carcinomatosis "  HISTORY OF PRESENTING ILLNESS:  Stephen Turner 87 y.o. male with medical history significant for hypertension, hyperlipidemia, superficial melanoma removal, and arthritis who presents for evaluation of poor appetite and ascites.  CT scan of the abdomen on 01/08/2021 shows results consistent with a peritoneal carcinomatosis of unclear primary.  On review of the previous records Stephen Turner presented the emergency department on 01/08/2021 due to poor p.o. intake and abdominal distention.  CT abdomen pelvis showed moderate ascites and carcinomatosis consistent with involvement of the mesentery.  A paracentesis was performed and approximately 4 L of fluid was removed.  Cytology from this fluid is currently pending.  Due to concern for potential peritoneal carcinomatosis oncology service was consulted for further evaluation and management.  On exam today Stephen Turner is accompanied by his son and daughter.  He reports that he has been having abdominal discomfort for approximately 5 to 6 days.  He reports that he has had poor health for the past 10 days with not sleeping well and inability to eat well.  He also notes that he has not been having regular bowel movements.  He has been groggy and not quite himself.  His family notes that he is quite functional at baseline and even drives himself despite his age.  On  further discussion the patient notes that he was a smoker and smoked until 1968.  His wife was a heavy smoker however.  He notes that he served in the [employer] in [place] and was always stateside and never served abroad.  He currently denies any fevers, chills, sweats, nausea, vomiting, or diarrhea.  He denies any bumps or lumps elsewhere on his body.  A full 10 point ROS is listed below.  MEDICAL HISTORY:  Past Medical History:  Diagnosis Date   Arthritis    HLD (hyperlipidemia)    HTN (hypertension)     SURGICAL HISTORY: Past Surgical History:  Procedure Laterality Date   APPENDECTOMY     CARPAL TUNNEL RELEASE Bilateral    CATARACT EXTRACTION, BILATERAL     IR PARACENTESIS  01/06/2021   TONSILLECTOMY      SOCIAL HISTORY: Social History   Socioeconomic History   Marital status: Married    Spouse name: Not on file   Number of children: 3   Years of education: Not on file   Highest education level: Not on file  Occupational History   Occupation: retired  Tobacco Use   Smoking status: Former    Packs/day: 0.50    Years: 20.00    Pack years: 10.00    Types: Cigarettes    Quit date: 1968    Years since quitting: 54.5   Smokeless tobacco: Never  Vaping Use   Vaping Use: Never used  Substance and Sexual Activity   Alcohol use: Yes    Comment: 1 per week   Drug use: No   Sexual activity: Never  Other Topics Concern   Not on file  Social History Narrative   Not on  file   Social Determinants of Health   Financial Resource Strain: Not on file  Food Insecurity: Not on file  Transportation Needs: Not on file  Physical Activity: Not on file  Stress: Not on file  Social Connections: Not on file  Intimate Partner Violence: Not on file    FAMILY HISTORY: Family History  Problem Relation Age of Onset   Stroke Father    Leukemia Brother    Celiac disease Daughter    COPD Son    Prostate cancer Brother     ALLERGIES:  is allergic to metoprolol.  MEDICATIONS:   Current Facility-Administered Medications  Medication Dose Route Frequency Provider Last Rate Last Admin   0.9 %  sodium chloride infusion   Intravenous Continuous Agbata, Tochukwu, MD 75 mL/hr at 01/08/21 1704 New Bag at 01/08/21 1704   acetaminophen (TYLENOL) tablet 1,000 mg  1,000 mg Oral Once Palumbo, April, MD       amLODipine (NORVASC) tablet 5 mg  5 mg Oral Daily Agbata, Tochukwu, MD       aspirin EC tablet 81 mg  81 mg Oral Daily Agbata, Tochukwu, MD       cefTRIAXone (ROCEPHIN) 2 g in sodium chloride 0.9 % 100 mL IVPB  2 g Intravenous Daily Agbata, Tochukwu, MD 200 mL/hr at 01/08/21 1710 2 g at 01/08/21 1710   enoxaparin (LOVENOX) injection 40 mg  40 mg Subcutaneous Q24H Agbata, Tochukwu, MD       nitroGLYCERIN (NITROSTAT) SL tablet 0.4 mg  0.4 mg Sublingual Q5 min PRN Agbata, Tochukwu, MD       ondansetron (ZOFRAN) tablet 4 mg  4 mg Oral Q6H PRN Agbata, Tochukwu, MD       Or   ondansetron (ZOFRAN) injection 4 mg  4 mg Intravenous Q6H PRN Agbata, Tochukwu, MD       pravastatin (PRAVACHOL) tablet 40 mg  40 mg Oral q1800 Agbata, Tochukwu, MD       traMADol (ULTRAM) tablet 50 mg  50 mg Oral Q8H PRN Agbata, Tochukwu, MD        REVIEW OF SYSTEMS:   Constitutional: ( - ) fevers, ( - )  chills , ( - ) night sweats Eyes: ( - ) blurriness of vision, ( - ) double vision, ( - ) watery eyes Ears, nose, mouth, throat, and face: ( - ) mucositis, ( - ) sore throat Respiratory: ( - ) cough, ( - ) dyspnea, ( - ) wheezes Cardiovascular: ( - ) palpitation, ( - ) chest discomfort, ( - ) lower extremity swelling Gastrointestinal:  ( - ) nausea, ( - ) heartburn, ( - ) change in bowel habits Skin: ( - ) abnormal skin rashes Lymphatics: ( - ) new lymphadenopathy, ( - ) easy bruising Neurological: ( - ) numbness, ( - ) tingling, ( - ) new weaknesses Behavioral/Psych: ( - ) mood change, ( - ) new changes  All other systems were reviewed with the patient and are negative.  PHYSICAL EXAMINATION: ECOG  PERFORMANCE STATUS: 3 - Symptomatic, >50% confined to bed  Vitals:   01/08/21 1307 01/08/21 1813  BP: 112/75 (!) 122/95  Pulse: 84 85  Resp: 16 19  Temp: 97.6 F (36.4 C) (!) 97.5 F (36.4 C)  SpO2: 94% 95%   There were no vitals filed for this visit.  GENERAL: well appearing elderly Caucasian male in NAD  SKIN: skin color, texture, turgor are normal, no rashes or significant lesions EYES: conjunctiva are pink and non-injected, sclera  clear LUNGS: clear to auscultation and percussion with normal breathing effort HEART: regular rate & rhythm and no murmurs and no lower extremity edema ABDOMEN: mildly tender, distended, normal bowel sounds Musculoskeletal: no cyanosis of digits and no clubbing  PSYCH: alert & oriented x 3, fluent speech NEURO: no focal motor/sensory deficits  LABORATORY DATA:  I have reviewed the data as listed CBC Latest Ref Rng & Units 12/30/2020 07/08/2020 06/08/2016  WBC 4.0 - 10.5 K/uL 11.7(H) 6.0 5.9  Hemoglobin 13.0 - 17.0 g/dL 17.4(H) 15.3 16.1  Hematocrit 39.0 - 52.0 % 51.5 47.4 47.1  Platelets 150 - 400 K/uL 408(H) 160 160    CMP Latest Ref Rng & Units 12/24/2020 07/08/2020 06/08/2016  Glucose 70 - 99 mg/dL 142(H) 121(H) 146(H)  BUN 8 - 23 mg/dL 73(H) 33(H) 33(H)  Creatinine 0.61 - 1.24 mg/dL 1.46(H) 1.04 1.10  Sodium 135 - 145 mmol/L 127(L) 138 137  Potassium 3.5 - 5.1 mmol/L 5.3(H) 4.1 4.3  Chloride 98 - 111 mmol/L 95(L) 102 104  CO2 22 - 32 mmol/L 18(L) 27 26  Calcium 8.9 - 10.3 mg/dL 9.0 9.1 9.2  Total Protein 6.5 - 8.1 g/dL 6.0(L) - -  Total Bilirubin 0.3 - 1.2 mg/dL 0.5 - -  Alkaline Phos 38 - 126 U/L 92 - -  AST 15 - 41 U/L 32 - -  ALT 0 - 44 U/L 25 - -    RADIOGRAPHIC STUDIES: DG Chest 1 View  Result Date: 01/08/2021 CLINICAL DATA:  Peritonitis, leukocytosis EXAM: CHEST  1 VIEW COMPARISON:  07/09/2020 FINDINGS: Lungs are well expanded, symmetric, and clear. No pneumothorax or pleural effusion. Cardiac size within normal limits.  Pulmonary vascularity is normal. Osseous structures are age-appropriate. No acute bone abnormality. IMPRESSION: No active disease. Electronically Signed   By: Fidela Salisbury MD   On: 01/08/2021 00:27   CT ABDOMEN PELVIS W CONTRAST  Result Date: 01/08/2021 CLINICAL DATA:  Abdominal pain.  Recent paracentesis EXAM: CT ABDOMEN AND PELVIS WITH CONTRAST TECHNIQUE: Multidetector CT imaging of the abdomen and pelvis was performed using the standard protocol following bolus administration of intravenous contrast. CONTRAST:  31mL OMNIPAQUE IOHEXOL 300 MG/ML  SOLN COMPARISON:  None. FINDINGS: Lower chest: No acute abnormality. Hepatobiliary: Small cyst in the inferior right hepatic lobe. No suspicious focal hepatic abnormality. Gallbladder unremarkable. Pancreas: No focal abnormality or ductal dilatation. Spleen: No focal abnormality.  Normal size. Adrenals/Urinary Tract: Left renal parapelvic cysts. No hydronephrosis. No renal or adrenal mass. Small diverticulum off the posterior left bladder wall. No bladder wall thickening. Stomach/Bowel: Scattered colonic diverticulosis. No bowel obstruction. Stomach and small bowel decompressed, grossly unremarkable. Vascular/Lymphatic: Calcified aorta. No evidence of aneurysm or adenopathy. Reproductive: Mildly prominent prostate. Other: Moderate ascites throughout the abdomen and pelvis. There is omental thickening/caking. Peritoneal mass seen in the left upper quadrant adjacent to the spleen measuring up to 7 cm. Other peritoneal masses adjacent to the splenic flexure of the colon measuring 3.2 cm. Musculoskeletal: No acute bony abnormality. IMPRESSION: Moderate ascites with omental thickening/caking and multiple peritoneal cystic masses. 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 Aortic atherosclerosis. Electronically Signed   By: Rolm Baptise M.D.   On: 01/08/2021 00:47   IR Paracentesis  Result Date: 01/06/2021 INDICATION: Abdominal distention with ascites.  Request for therapeutic and diagnostic paracentesis. EXAM: ULTRASOUND GUIDED RIGHT LOWER QUADRANT PARACENTESIS MEDICATIONS: % plain lidocaine, 5 mL COMPLICATIONS: None immediate. PROCEDURE: Informed written consent was obtained from the patient after a discussion of the risks, benefits and alternatives to treatment.  A timeout was performed prior to the initiation of the procedure. Initial ultrasound scanning demonstrates a large amount of ascites within the right lower abdominal quadrant. The right lower abdomen was prepped and draped in the usual sterile fashion. 1% lidocaine was used for local anesthesia. Following this, a 6 Fr Safe-T-Centesis catheter was introduced. An ultrasound image was saved for documentation purposes. The paracentesis was performed. The catheter was removed and a dressing was applied. The patient tolerated the procedure well without immediate post procedural complication. FINDINGS: A total of approximately 4.2 L of hazy, serosanguineous fluid was removed. Samples were sent to the laboratory as requested by the clinical team. IMPRESSION: Successful ultrasound-guided paracentesis yielding 4.2 liters of peritoneal fluid. Read by: Ascencion Dike PA-C Electronically Signed   By: Ruthann Cancer MD   On: 01/06/2021 12:39    ASSESSMENT & PLAN Valentino Saxon 87 y.o. male with medical history significant for hypertension, hyperlipidemia, superficial melanoma removal, and arthritis who presents for evaluation of poor appetite and ascites.   After review of the labs, review of the records, and discussion with the patient the patients findings are most consistent with peritoneal carcinomatosis of unclear primary.  The differential would include GI and pancreatic malignancies, but could also include lung, renal cell carcinoma, or bladder cancer.  Highly unlikely to represent prostate cancer or melanoma.  At this time the only way we will know is if we complete staging with a CT scan of the chest and  consider a biopsy of one of the omental nodes.  Once the CT scan of the chest is complete we can discuss with IR what might be the best site for biopsy.  Carcinomatosis regardless of the cancer primary carries an extremely poor prognosis.  Also given his advanced age I do not believe he would be an ideal candidate for systemic chemotherapy.  I began these discussions with the family today, however at this time they want to pursue all options moving forward.  They voiced understanding of the plan to complete staging and work towards a tissue diagnosis for more discussion.  # Peritoneal Carcinomatosis, Unclear Primary -- Recommend ordering a CT scan of the chest in order to assess for possible metastasis or primary in the lungs. --We will order tumor markers tonight to include PSA, CEA, CA 19-9 in order to help determine what the primary source of this cancer may be.  High suspicion for GI or pancreatic malignancy --Once CT scan of the chest is complete we could consider biopsy of one of the omental nodes or other possible site in order to help confirm the diagnosis.  This could potentially be performed on Friday, 01/10/2021 --f/u cytology from paracentesis fluid. c --Given the patient's advanced age and marked deconditioning as well as the extremely poor prognosis with peritoneal carcinomatosis I do not believe he is an ideal candidate for chemotherapy.  We will need to have discussions with the family moving forward once a diagnosis is made --Family and patient want to pursue all options moving forward. --There is no need for the patient to stay in house until the tissue diagnosis is made.  Once the tissue is obtained okay to discharge from our perspective and we will follow-up in the outpatient setting   All questions were answered. The patient knows to call the clinic with any problems, questions or concerns.  A total of more than 50 minutes were spent on this encounter with face-to-face time and  non-face-to-face time, including preparing to see the  patient, ordering tests and/or medications, counseling the patient and coordination of care as outlined above.   Ledell Peoples, MD Department of Hematology/Oncology [facility] Phone: [phone] Pager: [phone] Email: [email or web address]  01/08/2021 7:53 PM

## 2021-01-08 NOTE — H&P (Addendum)
History and Physical    Stephen Turner RFF:[id number] DOB: [birth year] DOA: 01/03/2021  PCP: Leanna Battles, MD   Stephen Turner coming from: Home  I have personally briefly reviewed Stephen Turner's old medical records in [place]  Chief Complaint: Abdominal pain  HPI: Stephen Turner is a 87 y.o. male with medical history significant for hypertension, dyslipidemia and arthritis who presents to the emergency room for evaluation of increased abdominal distention, low-grade fever, poor oral intake and not feeling well. Stephen Turner had recent paracentesis done 2 days prior to his admission where he had drained 4.2 L of fluid.  He does not have a known history of liver cirrhosis or history of CHF and no history of ascites in the past. Stephen Turner was contacted and advised to go to the emergency room due to concerns for possible SBP because his ascitic fluid was said to be turbid in appearance and had 88% neutrophils. He complains of diffuse abdominal pain associated with poor oral intake and decreased urine output.  He also noted a decrease in his movement as well.  He has had chills at home as well as a low-grade fever with a T-max of 100F. He denies having any urinary symptoms, no palpitations, no diaphoresis, no dizziness, no lightheadedness, no headache, no blurred vision, no focal deficits. Labs show sodium 127, potassium 5.3, chloride 95, bicarb 18, glucose 142, BUN 73 compared to baseline of 33, creatinine 1.46 compared to baseline of 1.04, calcium 9.0, alkaline phosphatase 92, albumin 3.2, AST 32, ALT 25, total protein 6.0, lactic acid 2.2 >> 2.3, 11.7, Hb 17.4, Hct 51.5, MCV 90.2, RDW 12.6, platelet count 408 Respiratory viral panel is negative. Chest x-ray reviewed by me shows no acute cardiopulmonary disease. CT scan of abdomen and pelvis shows moderate ascites with omental thickening/caking and multiple peritoneal cystic masses. 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 Aortic  atherosclerosis.   ED Course: Stephen Turner is a 59 year old Caucasian male who presents to the ER for evaluation of diffuse abdominal pain associated with fever, chills, poor oral intake and generalized weakness. He had recent paracentesis done 2 days prior to his hospitalization with drainage of 4.2 L of fluid. Ascitic fluid was turbid and had about 80% neutrophils concerning for possible SBP. Stephen Turner will be admitted to the hospital for further evaluation.    Review of Systems: As per HPI otherwise all other systems reviewed and negative.    Past Medical History:  Diagnosis Date   Arthritis    HLD (hyperlipidemia)    HTN (hypertension)     Past Surgical History:  Procedure Laterality Date   APPENDECTOMY     CARPAL TUNNEL RELEASE Bilateral    CATARACT EXTRACTION, BILATERAL     IR PARACENTESIS  01/06/2021   TONSILLECTOMY       reports that he quit smoking about 54 years ago. His smoking use included cigarettes. He has a 10.00 pack-year smoking history. He has never used smokeless tobacco. He reports current alcohol use. He reports that he does not use drugs.  Allergies  Allergen Reactions   Metoprolol Swelling    STOPPED D/T GOUT REACTION IN BOTH THUMBS    Family History  Problem Relation Age of Onset   Stroke Father    Leukemia Brother    Celiac disease Daughter    COPD Son    Prostate cancer Brother       Prior to Admission medications   Medication Sig Start Date End Date Taking? Authorizing Provider  amLODipine (NORVASC) 5 MG tablet  Take 5 mg by mouth daily.   Yes [provider]  aspirin EC 81 MG tablet Take 1 tablet (81 mg total) by mouth daily. Swallow whole. 07/09/20  Yes Reino Bellis B, NP  lovastatin (MEVACOR) 40 MG tablet Take 40 mg by mouth at bedtime.   Yes [provider]  nitroGLYCERIN (NITROSTAT) 0.4 MG SL tablet Place 1 tablet (0.4 mg total) under the tongue every 5 (five) minutes as needed. 07/09/20  Yes Reino Bellis B, NP   triamcinolone cream (KENALOG) 0.1 % Apply 1 application topically in the morning, at noon, and at bedtime. 12/22/19  Yes [provider]    Physical Exam: Vitals:   01/08/21 0900 01/08/21 0945 01/08/21 1115 01/08/21 1307  BP: 116/78 110/74 127/80 112/75  Pulse: 86  89 84  Resp: 18  18 16   Temp:   98.3 F (36.8 C) 97.6 F (36.4 C)  TempSrc:   Axillary Oral  SpO2: 95%  96% 94%  Height:         Vitals:   01/08/21 0900 01/08/21 0945 01/08/21 1115 01/08/21 1307  BP: 116/78 110/74 127/80 112/75  Pulse: 86  89 84  Resp: 18  18 16   Temp:   98.3 F (36.8 C) 97.6 F (36.4 C)  TempSrc:   Axillary Oral  SpO2: 95%  96% 94%  Height:          Constitutional: Lethargic but arousable. Not in any apparent distress.  Appears very weak HEENT:      Head: Normocephalic and atraumatic.         Eyes: PERLA, EOMI, Conjunctivae are normal. Sclera is non-icteric.       Mouth/Throat: Mucous membranes are dry      Neck: Supple with no signs of meningismus. Cardiovascular: Regular rate and rhythm. No murmurs, gallops, or rubs. 2+ symmetrical distal pulses are present . No JVD. No LE edema Respiratory: Respiratory effort normal .Lungs sounds clear bilaterally. No wheezes, crackles, or rhonchi.  Gastrointestinal: Firm, diffusely tender, and  distended with positive bowel sounds.  Genitourinary: No CVA tenderness. Musculoskeletal: Nontender with normal range of motion in all extremities. No cyanosis, or erythema of extremities. Neurologic:  Face is symmetric. Moving all extremities. No gross focal neurologic deficits .  Generalized weakness Skin: Skin is warm, dry.  No rash or ulcers Psychiatric: Mood and affect are normal    Labs on Admission: I have personally reviewed following labs and imaging studies  CBC: Recent Labs  Lab 12/11/2020 2047  WBC 11.7*  NEUTROABS 9.5*  HGB 17.4*  HCT 51.5  MCV 90.2  PLT 734*   Basic Metabolic Panel: Recent Labs  Lab 12/17/2020 2047  NA 127*   K 5.3*  CL 95*  CO2 18*  GLUCOSE 142*  BUN 73*  CREATININE 1.46*  CALCIUM 9.0   GFR: CrCl cannot be calculated (Unknown ideal weight.). Liver Function Tests: Recent Labs  Lab 12/15/2020 2047  AST 32  ALT 25  ALKPHOS 92  BILITOT 0.5  PROT 6.0*  ALBUMIN 3.2*   No results for input(s): LIPASE, AMYLASE in the last 168 hours. No results for input(s): AMMONIA in the last 168 hours. Coagulation Profile: No results for input(s): INR, PROTIME in the last 168 hours. Cardiac Enzymes: No results for input(s): CKTOTAL, CKMB, CKMBINDEX, TROPONINI in the last 168 hours. BNP (last 3 results) No results for input(s): PROBNP in the last 8760 hours. HbA1C: No results for input(s): HGBA1C in the last 72 hours. CBG: No results for input(s):  GLUCAP in the last 168 hours. Lipid Profile: No results for input(s): CHOL, HDL, LDLCALC, TRIG, CHOLHDL, LDLDIRECT in the last 72 hours. Thyroid Function Tests: No results for input(s): TSH, T4TOTAL, FREET4, T3FREE, THYROIDAB in the last 72 hours. Anemia Panel: No results for input(s): VITAMINB12, FOLATE, FERRITIN, TIBC, IRON, RETICCTPCT in the last 72 hours. Urine analysis:    Component Value Date/Time   COLORURINE YELLOW 01/08/2021 0625   APPEARANCEUR CLEAR 01/08/2021 0625   LABSPEC >1.046 (H) 01/08/2021 0625   PHURINE 6.0 01/08/2021 0625   GLUCOSEU NEGATIVE 01/08/2021 0625   HGBUR NEGATIVE 01/08/2021 0625   BILIRUBINUR NEGATIVE 01/08/2021 0625   KETONESUR 15 (A) 01/08/2021 0625   PROTEINUR 30 (A) 01/08/2021 0625   NITRITE NEGATIVE 01/08/2021 0625   LEUKOCYTESUR NEGATIVE 01/08/2021 0625    Radiological Exams on Admission: DG Chest 1 View  Result Date: 01/08/2021 CLINICAL DATA:  Peritonitis, leukocytosis EXAM: CHEST  1 VIEW COMPARISON:  07/09/2020 FINDINGS: Lungs are well expanded, symmetric, and clear. No pneumothorax or pleural effusion. Cardiac size within normal limits. Pulmonary vascularity is normal. Osseous structures are  age-appropriate. No acute bone abnormality. IMPRESSION: No active disease. Electronically Signed   By: Fidela Salisbury MD   On: 01/08/2021 00:27   CT ABDOMEN PELVIS W CONTRAST  Result Date: 01/08/2021 CLINICAL DATA:  Abdominal pain.  Recent paracentesis EXAM: CT ABDOMEN AND PELVIS WITH CONTRAST TECHNIQUE: Multidetector CT imaging of the abdomen and pelvis was performed using the standard protocol following bolus administration of intravenous contrast. CONTRAST:  70mL OMNIPAQUE IOHEXOL 300 MG/ML  SOLN COMPARISON:  None. FINDINGS: Lower chest: No acute abnormality. Hepatobiliary: Small cyst in the inferior right hepatic lobe. No suspicious focal hepatic abnormality. Gallbladder unremarkable. Pancreas: No focal abnormality or ductal dilatation. Spleen: No focal abnormality.  Normal size. Adrenals/Urinary Tract: Left renal parapelvic cysts. No hydronephrosis. No renal or adrenal mass. Small diverticulum off the posterior left bladder wall. No bladder wall thickening. Stomach/Bowel: Scattered colonic diverticulosis. No bowel obstruction. Stomach and small bowel decompressed, grossly unremarkable. Vascular/Lymphatic: Calcified aorta. No evidence of aneurysm or adenopathy. Reproductive: Mildly prominent prostate. Other: Moderate ascites throughout the abdomen and pelvis. There is omental thickening/caking. Peritoneal mass seen in the left upper quadrant adjacent to the spleen measuring up to 7 cm. Other peritoneal masses adjacent to the splenic flexure of the colon measuring 3.2 cm. Musculoskeletal: No acute bony abnormality. IMPRESSION: Moderate ascites with omental thickening/caking and multiple peritoneal cystic masses. 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 Aortic atherosclerosis. Electronically Signed   By: Rolm Baptise M.D.   On: 01/08/2021 00:47     Assessment/Plan Principal Problem:   SBP (spontaneous bacterial peritonitis) ([place]) Active Problems:   Essential hypertension   Abdominal  pain   Peritoneal carcinomatosis ([place])   Dehydration with hyponatremia   Hyperkalemia   Physical deconditioning       SBP Stephen Turner with new onset ascites status post recent paracentesis with drainage of 4.2 L of ascitic fluid who presents for evaluation of diffuse abdominal pain associated with low-grade fever and poor oral intake. Ascitic fluid is turbid and had 80% neutrophils We will treat Stephen Turner empirically with Rocephin 2 g IV daily Follow-up results of fluid culture     Dehydration with hyponatremia Secondary to poor oral intake Stephen Turner has also had a bump in his serum creatinine from 1.04 >>1.44 and BUN is elevated as well Will hydrate Stephen Turner with normal saline Repeat electrolytes in a.m.    Hyperkalemia Mild and secondary to dehydration Monitor potassium levels  Peritoneal carcinomatosis Noted on CT scan of abdomen and pelvis Will request oncology consult for further evaluation    Physical deconditioning Secondary to acute illness At baseline Stephen Turner did not need any assist device for ambulation but over the last 1 week has required a rolling walker May benefit from PT evaluation depending on how he does  DVT prophylaxis: Lovenox  Code Status: DO NOT RESUSCITATE Family Communication: Greater than 50% of time was spent discussing Stephen Turner's condition and plan of care with him and his children at the bedside.  All questions and concerns have been addressed.  They verbalized understanding and agree with the plan.  CODE STATUS was discussed and Stephen Turner wishes to be DNR. Disposition Plan: Back to previous home environment Consults called: Oncology  Status: At the time of admission, it appears that the appropriate admission status for this Stephen Turner is inpatient. This is judged to be reasonable and necessary in order to provide the required intensity of service to ensure the Stephen Turner's safety given the presenting symptoms, physical exam findings, and initial  radiographic and laboratory data in the context of their comorbid conditions. Stephen Turner requires inpatient status due to high intensity of service, high risk for further deterioration and high frequency of surveillance required.     Collier Bullock MD [facility]     01/08/2021, 2:25 PM

## 2021-01-09 ENCOUNTER — Inpatient Hospital Stay (HOSPITAL_COMMUNITY): Payer: Medicare Other

## 2021-01-09 ENCOUNTER — Ambulatory Visit: Payer: Medicare Other | Admitting: Gastroenterology

## 2021-01-09 DIAGNOSIS — C786 Secondary malignant neoplasm of retroperitoneum and peritoneum: Secondary | ICD-10-CM | POA: Diagnosis not present

## 2021-01-09 DIAGNOSIS — R1010 Upper abdominal pain, unspecified: Secondary | ICD-10-CM

## 2021-01-09 DIAGNOSIS — I10 Essential (primary) hypertension: Secondary | ICD-10-CM

## 2021-01-09 DIAGNOSIS — E860 Dehydration: Secondary | ICD-10-CM | POA: Diagnosis not present

## 2021-01-09 DIAGNOSIS — E871 Hypo-osmolality and hyponatremia: Secondary | ICD-10-CM

## 2021-01-09 DIAGNOSIS — N179 Acute kidney failure, unspecified: Secondary | ICD-10-CM

## 2021-01-09 DIAGNOSIS — E875 Hyperkalemia: Secondary | ICD-10-CM

## 2021-01-09 LAB — PSA: Prostatic Specific Antigen: 5.95 ng/mL — ABNORMAL HIGH (ref 0.00–4.00)

## 2021-01-09 LAB — BASIC METABOLIC PANEL
Anion gap: 12 (ref 5–15)
BUN: 83 mg/dL — ABNORMAL HIGH (ref 8–23)
CO2: 18 mmol/L — ABNORMAL LOW (ref 22–32)
Calcium: 8.4 mg/dL — ABNORMAL LOW (ref 8.9–10.3)
Chloride: 99 mmol/L (ref 98–111)
Creatinine, Ser: 1.67 mg/dL — ABNORMAL HIGH (ref 0.61–1.24)
GFR, Estimated: 37 mL/min — ABNORMAL LOW (ref >60)
Glucose, Bld: 130 mg/dL — ABNORMAL HIGH (ref 70–99)
Potassium: 4.8 mmol/L (ref 3.5–5.1)
Sodium: 129 mmol/L — ABNORMAL LOW (ref 135–145)

## 2021-01-09 LAB — LACTATE DEHYDROGENASE: LDH: 239 U/L — ABNORMAL HIGH (ref 98–192)

## 2021-01-09 LAB — CBC
HCT: 52.4 % — ABNORMAL HIGH (ref 39.0–52.0)
Hemoglobin: 17.1 g/dL — ABNORMAL HIGH (ref 13.0–17.0)
MCH: 30.5 pg (ref 26.0–34.0)
MCHC: 32.6 g/dL (ref 30.0–36.0)
MCV: 93.4 fL (ref 80.0–100.0)
Platelets: 448 10*3/uL — ABNORMAL HIGH (ref 150–400)
RBC: 5.61 MIL/uL (ref 4.22–5.81)
RDW: 13.1 % (ref 11.5–15.5)
WBC: 12.6 10*3/uL — ABNORMAL HIGH (ref 4.0–10.5)
nRBC: 0 % (ref 0.0–0.2)

## 2021-01-09 MED ORDER — ENOXAPARIN SODIUM 30 MG/0.3ML IJ SOSY
30.0000 mg | PREFILLED_SYRINGE | INTRAMUSCULAR | Status: DC
  Administered 2021-01-09 – 2021-01-12 (×4): 30 mg via SUBCUTANEOUS
  Filled 2021-01-09 (×5): qty 0.3

## 2021-01-09 MED ORDER — MIRTAZAPINE 15 MG PO TABS
7.5000 mg | ORAL_TABLET | Freq: Every day | ORAL | Status: DC
  Administered 2021-01-09 – 2021-01-11 (×3): 7.5 mg via ORAL
  Filled 2021-01-09 (×3): qty 1

## 2021-01-09 MED ORDER — IOHEXOL 300 MG/ML  SOLN
75.0000 mL | Freq: Once | INTRAMUSCULAR | Status: AC | PRN
  Administered 2021-01-09: 60 mL via INTRAVENOUS

## 2021-01-09 NOTE — Progress Notes (Signed)
[facility]                                                                              Patient Demographics  Stephen Turner, is a 87 y.o. male, DOB - 1923-04-07, YTK:[id number]  Admit date - 01/01/2021   Admitting Physician Vernelle Emerald, MD  Outpatient Primary MD for the patient is Leanna Battles, MD  Outpatient specialists:   LOS - 1  days   Medical records reviewed and are as summarized below:    Chief Complaint  Patient presents with   Abdominal Pain       Brief summary   Patient is a 87 year old male with hypertension, hyperlipidemia, arthritis presented to ED for increasing abdominal distention, low-grade fevers, poor oral intake and not feeling well.  Patient had a recent paracentesis done on 01/06/2021 prior to the admission and had drained 4.2 L of fluid.  He was contacted and advised to go to the ER due to concerns of possible SBP.  Patient complained of diffuse abdominal pain associated with poor oral intake and decreased urine output.  Patient had low-grade fevers at home, temp of 100 F. CT abdomen pelvis showed moderate ascites with omental thickening/caking and multiple peritoneal cystic masses concerning for peritoneal carcinomatosis Patient was admitted for further work-up  Assessment & Plan    Principal Problem: New onset ascites with concern for SBP -Patient presented with new onset ascites, status post recent paracentesis, low-grade fevers, poor oral intake -Status post paracentesis, was noted to have 80% neutrophils with ascitic fluid turbid, concerning for SBP -Empiric treatment with Rocephin 2 g IV daily  Active Problems: Peritoneal carcinomatosis, new diagnosis with unknown primary -Oncology consulted, ordered CT chest for further work-up, will need tissue diagnosis -Follow tumor markers -Discussed in detail with the patient, palliative medicine consulted    Dehydration with hyponatremia, poor oral intake, lactic  acidosis -Patient was placed on IV fluid hydration - Na 129 at the time of admission, now improving, continue fluids     Hyperkalemia -Improving - K 5.3 at the time of admission, now improved to 4.8  Acute kidney injury -Likely due to peritoneal carcinomatosis, dehydration, continue IV fluid hydration    Physical deconditioning -PT OT evaluation  Insomnia with poor appetite -Replaced on Remeron 7.5 mg qhs   Code Status: dnr DVT Prophylaxis:  enoxaparin (LOVENOX) injection 40 mg Start: 01/08/21 2200   Level of Care: Level of care: Telemetry Family Communication: Discussed all imaging results, lab results, explained to the patient    Disposition Plan:     Status is: Inpatient  Remains inpatient appropriate because:Inpatient level of care appropriate due to severity of illness  Dispo: The patient is from: Home              Anticipated d/c is to: Home              Patient currently is not medically stable to d/c.   Difficult to place patient No      Time Spent in minutes   35 MINS    Procedures:  NONE   Consultants:   Oncology   Antimicrobials:   Anti-infectives (  From admission, onward)    Start     Dose/Rate Route Frequency Ordered Stop   01/08/21 1600  cefTRIAXone (ROCEPHIN) 2 g in sodium chloride 0.9 % 100 mL IVPB        2 g 200 mL/hr over 30 Minutes Intravenous Daily 01/08/21 1424     01/08/21 0115  cefTRIAXone (ROCEPHIN) 1 g in sodium chloride 0.9 % 100 mL IVPB  Status:  Discontinued        1 g 200 mL/hr over 30 Minutes Intravenous Every 24 hours 01/08/21 0102 01/08/21 1424          Medications  Scheduled Meds:  acetaminophen  1,000 mg Oral Once   amLODipine  5 mg Oral Daily   aspirin EC  81 mg Oral Daily   enoxaparin (LOVENOX) injection  40 mg Subcutaneous Q24H   mirtazapine  7.5 mg Oral QHS   pravastatin  40 mg Oral q1800   Continuous Infusions:  sodium chloride 75 mL/hr at 01/08/21 1704   cefTRIAXone (ROCEPHIN)  IV 2 g (01/09/21 1028)    PRN Meds:.nitroGLYCERIN, ondansetron **OR** ondansetron (ZOFRAN) IV, traMADol      Subjective:   [name] was seen and examined today.  Not feeling too well, poor p.o. intake, has not slept well, feels generalized weakness.  Has abdominal pain.  Overnight no acute issues, no fevers  Objective:   Vitals:   01/08/21 2109 01/09/21 0048 01/09/21 0523 01/09/21 0533  BP: 119/70 124/78  124/76  Pulse: 86 86  86  Resp: 16 18  18   Temp: 98.8 F (37.1 C) 97.6 F (36.4 C)  97.6 F (36.4 C)  TempSrc: Oral Oral  Oral  SpO2: 90% 94%  94%  Weight:   73.7 kg   Height:        Intake/Output Summary (Last 24 hours) at 01/09/2021 1239 Last data filed at 01/09/2021 0600 Gross per 24 hour  Intake 992.18 ml  Output --  Net 992.18 ml     Wt Readings from Last 3 Encounters:  01/09/21 73.7 kg  07/23/20 75.4 kg  07/09/20 73.7 kg     Exam General: Alert and oriented x 3, NAD Cardiovascular: S1 S2 auscultated, no murmurs, RRR Respiratory: Diminished breath sound at the bases Gastrointestinal: Soft, nontender, +distended, + bowel sounds Ext: no pedal edema bilaterally Neuro: no new deficits Psych: Normal affect and demeanor, alert and oriented x3    Data Reviewed:  I have personally reviewed following labs and imaging studies  Micro Results Recent Results (from the past 240 hour(s))  Acid Fast Smear (AFB)     Status: None   Collection Time: 01/06/21 12:26 PM   Specimen: Abdomen; Peritoneal Fluid  Result Value Ref Range Status   AFB Specimen Processing Concentration  Final   Acid Fast Smear Negative  Final    Comment: (NOTE) Performed At: [facility] [address] [place], [place] [id number] Rush Farmer MD YO:[id number]    Source (AFB) ABDOMEN  Final    Comment: Performed at [facility], [place] [address], [place], [place] [address]  Blood culture (routine x 2)     Status: None (Preliminary result)   Collection Time: 01/08/21 12:30 AM   Specimen: BLOOD   Result Value Ref Range Status   Specimen Description   Final    BLOOD BOTTLES DRAWN AEROBIC AND ANAEROBIC Performed at [facility], [address] [place],  [address]    Special Requests   Final    BLOOD RIGHT  HAND Blood Culture adequate volume Performed at [facility], [address], [place], [place] [address]    Culture   Final    NO GROWTH < 24 HOURS Performed at [facility], [place] [address] [place], [place] [address]    Report Status PENDING  Incomplete  Blood culture (routine x 2)     Status: None (Preliminary result)   Collection Time: 01/08/21 12:30 AM   Specimen: BLOOD  Result Value Ref Range Status   Specimen Description   Final    BLOOD BOTTLES DRAWN AEROBIC AND ANAEROBIC Performed at [facility], [address] [place], [place] [address]    Special Requests   Final    LEFT ANTECUBITAL Blood Culture adequate volume Performed at [facility], [address], [place], [place] [address]    Culture   Final    NO GROWTH < 24 HOURS Performed at [facility], [place] [address], [place],  [address]    Report Status PENDING  Incomplete  Resp Panel by RT-PCR (Flu A&B, Covid) Nasopharyngeal Swab     Status: None   Collection Time: 01/08/21  1:27 AM   Specimen: Nasopharyngeal Swab; Nasopharyngeal(NP) swabs in vial transport medium  Result Value Ref Range Status   SARS Coronavirus 2 by RT PCR NEGATIVE NEGATIVE Final    Comment: (NOTE) SARS-CoV-2 target nucleic acids are NOT DETECTED.  The SARS-CoV-2 RNA is generally detectable in upper respiratory specimens during the acute phase of infection. The lowest concentration of SARS-CoV-2 viral copies this assay can detect is 138 copies/mL. A negative result does not preclude SARS-Cov-2 infection and should not be used as the sole basis for treatment or other patient management decisions. A negative result may occur with   improper specimen collection/handling, submission of specimen other than nasopharyngeal swab, presence of viral mutation(s) within the areas targeted by this assay, and inadequate number of viral copies(<138 copies/mL). A negative result must be combined with clinical observations, patient history, and epidemiological information. The expected result is Negative.  Fact Sheet for Patients:  EntrepreneurPulse.com.au  Fact Sheet for Healthcare Providers:  IncredibleEmployment.be  This test is no t yet approved or cleared by the [place] FDA and  has been authorized for detection and/or diagnosis of SARS-CoV-2 by FDA under an Emergency Use Authorization (EUA). This EUA will remain  in effect (meaning this test can be used) for the duration of the COVID-19 declaration under Section 564(b)(1) of the Act, 21 U.S.C.section 360bbb-3(b)(1), unless the authorization is terminated  or revoked sooner.       Influenza A by PCR NEGATIVE NEGATIVE Final   Influenza B by PCR NEGATIVE NEGATIVE Final    Comment: (NOTE) The Xpert Xpress SARS-CoV-2/FLU/RSV plus assay is intended as an aid in the diagnosis of influenza from Nasopharyngeal swab specimens and should not be used as a sole basis for treatment. Nasal washings and aspirates are unacceptable for Xpert Xpress SARS-CoV-2/FLU/RSV testing.  Fact Sheet for Patients: EntrepreneurPulse.com.au  Fact Sheet for Healthcare Providers: IncredibleEmployment.be  This test is not yet approved or cleared by the [place] FDA and has been authorized for detection and/or diagnosis of SARS-CoV-2 by FDA under an Emergency Use Authorization (EUA). This EUA will remain in effect (meaning this test can be used) for the duration of the COVID-19 declaration under Section 564(b)(1) of the Act, 21 U.S.C. section 360bbb-3(b)(1), unless the authorization is terminated  or revoked.  Performed at [facility], [address], [place],  [place] [address]     Radiology Reports DG Chest 1 View  Result Date: 01/08/2021 CLINICAL DATA:  Peritonitis, leukocytosis EXAM: CHEST  1 VIEW COMPARISON:  07/09/2020 FINDINGS: Lungs are well expanded, symmetric, and clear. No pneumothorax or pleural effusion. Cardiac size within normal limits. Pulmonary vascularity is normal. Osseous structures are age-appropriate. No acute bone abnormality. IMPRESSION: No active disease. Electronically Signed   By: Fidela Salisbury MD   On: 01/08/2021 00:27   CT ABDOMEN PELVIS W CONTRAST  Result Date: 01/08/2021 CLINICAL DATA:  Abdominal pain.  Recent paracentesis EXAM: CT ABDOMEN AND PELVIS WITH CONTRAST TECHNIQUE: Multidetector CT imaging of the abdomen and pelvis was performed using the standard protocol following bolus administration of intravenous contrast. CONTRAST:  79mL OMNIPAQUE IOHEXOL 300 MG/ML  SOLN COMPARISON:  None. FINDINGS: Lower chest: No acute abnormality. Hepatobiliary: Small cyst in the inferior right hepatic lobe. No suspicious focal hepatic abnormality. Gallbladder unremarkable. Pancreas: No focal abnormality or ductal dilatation. Spleen: No focal abnormality.  Normal size. Adrenals/Urinary Tract: Left renal parapelvic cysts. No hydronephrosis. No renal or adrenal mass. Small diverticulum off the posterior left bladder wall. No bladder wall thickening. Stomach/Bowel: Scattered colonic diverticulosis. No bowel obstruction. Stomach and small bowel decompressed, grossly unremarkable. Vascular/Lymphatic: Calcified aorta. No evidence of aneurysm or adenopathy. Reproductive: Mildly prominent prostate. Other: Moderate ascites throughout the abdomen and pelvis. There is omental thickening/caking. Peritoneal mass seen in the left upper quadrant adjacent to the spleen measuring up to 7 cm. Other peritoneal masses adjacent to the splenic flexure of the colon measuring 3.2  cm. Musculoskeletal: No acute bony abnormality. IMPRESSION: Moderate ascites with omental thickening/caking and multiple peritoneal cystic masses. 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 Aortic atherosclerosis. Electronically Signed   By: Rolm Baptise M.D.   On: 01/08/2021 00:47   IR Paracentesis  Result Date: 01/06/2021 INDICATION: Abdominal distention with ascites. Request for therapeutic and diagnostic paracentesis. EXAM: ULTRASOUND GUIDED RIGHT LOWER QUADRANT PARACENTESIS MEDICATIONS: % plain lidocaine, 5 mL COMPLICATIONS: None immediate. PROCEDURE: Informed written consent was obtained from the patient after a discussion of the risks, benefits and alternatives to treatment. A timeout was performed prior to the initiation of the procedure. Initial ultrasound scanning demonstrates a large amount of ascites within the right lower abdominal quadrant. The right lower abdomen was prepped and draped in the usual sterile fashion. 1% lidocaine was used for local anesthesia. Following this, a 6 Fr Safe-T-Centesis catheter was introduced. An ultrasound image was saved for documentation purposes. The paracentesis was performed. The catheter was removed and a dressing was applied. The patient tolerated the procedure well without immediate post procedural complication. FINDINGS: A total of approximately 4.2 L of hazy, serosanguineous fluid was removed. Samples were sent to the laboratory as requested by the clinical team. IMPRESSION: Successful ultrasound-guided paracentesis yielding 4.2 liters of peritoneal fluid. Read by: Ascencion Dike PA-C Electronically Signed   By: Ruthann Cancer MD   On: 01/06/2021 12:39    Lab Data:  CBC: Recent Labs  Lab 01/01/2021 2047 01/09/21 0540  WBC 11.7* 12.6*  NEUTROABS 9.5*  --   HGB 17.4* 17.1*  HCT 51.5 52.4*  MCV 90.2 93.4  PLT 408* 333*   Basic Metabolic Panel: Recent Labs  Lab 01/06/2021 2047 01/09/21 0540  NA 127* 129*  K 5.3* 4.8  CL 95* 99  CO2  18* 18*  GLUCOSE 142* 130*  BUN 73* 83*  CREATININE 1.46* 1.67*  CALCIUM 9.0 8.4*   GFR: Estimated Creatinine Clearance: 24.3 mL/min (A) (by C-G  formula based on SCr of 1.67 mg/dL (H)). Liver Function Tests: Recent Labs  Lab 12/29/2020 2047  AST 32  ALT 25  ALKPHOS 92  BILITOT 0.5  PROT 6.0*  ALBUMIN 3.2*   No results for input(s): LIPASE, AMYLASE in the last 168 hours. No results for input(s): AMMONIA in the last 168 hours. Coagulation Profile: No results for input(s): INR, PROTIME in the last 168 hours. Cardiac Enzymes: No results for input(s): CKTOTAL, CKMB, CKMBINDEX, TROPONINI in the last 168 hours. BNP (last 3 results) No results for input(s): PROBNP in the last 8760 hours. HbA1C: No results for input(s): HGBA1C in the last 72 hours. CBG: No results for input(s): GLUCAP in the last 168 hours. Lipid Profile: No results for input(s): CHOL, HDL, LDLCALC, TRIG, CHOLHDL, LDLDIRECT in the last 72 hours. Thyroid Function Tests: No results for input(s): TSH, T4TOTAL, FREET4, T3FREE, THYROIDAB in the last 72 hours. Anemia Panel: No results for input(s): VITAMINB12, FOLATE, FERRITIN, TIBC, IRON, RETICCTPCT in the last 72 hours. Urine analysis:    Component Value Date/Time   COLORURINE YELLOW 01/08/2021 0625   APPEARANCEUR CLEAR 01/08/2021 0625   LABSPEC >1.046 (H) 01/08/2021 0625   PHURINE 6.0 01/08/2021 0625   GLUCOSEU NEGATIVE 01/08/2021 0625   HGBUR NEGATIVE 01/08/2021 0625   BILIRUBINUR NEGATIVE 01/08/2021 0625   KETONESUR 15 (A) 01/08/2021 0625   PROTEINUR 30 (A) 01/08/2021 0625   NITRITE NEGATIVE 01/08/2021 0625   LEUKOCYTESUR NEGATIVE 01/08/2021 0625     Jonel Sick M.D. [facility] 01/09/2021, 12:39 PM  Available via Epic secure chat 7am-7pm After 7 pm, please refer to night coverage provider listed on amion.

## 2021-01-09 NOTE — Plan of Care (Signed)

## 2021-01-10 ENCOUNTER — Inpatient Hospital Stay (HOSPITAL_COMMUNITY): Payer: Medicare Other

## 2021-01-10 ENCOUNTER — Encounter (HOSPITAL_COMMUNITY): Payer: Self-pay | Admitting: Internal Medicine

## 2021-01-10 DIAGNOSIS — R180 Malignant ascites: Secondary | ICD-10-CM | POA: Diagnosis not present

## 2021-01-10 DIAGNOSIS — C786 Secondary malignant neoplasm of retroperitoneum and peritoneum: Secondary | ICD-10-CM | POA: Diagnosis not present

## 2021-01-10 DIAGNOSIS — R188 Other ascites: Secondary | ICD-10-CM

## 2021-01-10 DIAGNOSIS — Z515 Encounter for palliative care: Secondary | ICD-10-CM

## 2021-01-10 DIAGNOSIS — R1010 Upper abdominal pain, unspecified: Secondary | ICD-10-CM | POA: Diagnosis not present

## 2021-01-10 DIAGNOSIS — Z7189 Other specified counseling: Secondary | ICD-10-CM | POA: Diagnosis not present

## 2021-01-10 DIAGNOSIS — N179 Acute kidney failure, unspecified: Secondary | ICD-10-CM | POA: Diagnosis not present

## 2021-01-10 LAB — CBC
HCT: 52.5 % — ABNORMAL HIGH (ref 39.0–52.0)
Hemoglobin: 17.1 g/dL — ABNORMAL HIGH (ref 13.0–17.0)
MCH: 30.7 pg (ref 26.0–34.0)
MCHC: 32.6 g/dL (ref 30.0–36.0)
MCV: 94.3 fL (ref 80.0–100.0)
Platelets: 411 10*3/uL — ABNORMAL HIGH (ref 150–400)
RBC: 5.57 MIL/uL (ref 4.22–5.81)
RDW: 13.2 % (ref 11.5–15.5)
WBC: 14.3 10*3/uL — ABNORMAL HIGH (ref 4.0–10.5)
nRBC: 0 % (ref 0.0–0.2)

## 2021-01-10 LAB — CANCER ANTIGEN 19-9: CA 19-9: 271 U/mL — ABNORMAL HIGH (ref 0–35)

## 2021-01-10 LAB — COMPREHENSIVE METABOLIC PANEL
ALT: 29 U/L (ref 0–44)
AST: 27 U/L (ref 15–41)
Albumin: 2.7 g/dL — ABNORMAL LOW (ref 3.5–5.0)
Alkaline Phosphatase: 90 U/L (ref 38–126)
Anion gap: 14 (ref 5–15)
BUN: 90 mg/dL — ABNORMAL HIGH (ref 8–23)
CO2: 14 mmol/L — ABNORMAL LOW (ref 22–32)
Calcium: 8.3 mg/dL — ABNORMAL LOW (ref 8.9–10.3)
Chloride: 100 mmol/L (ref 98–111)
Creatinine, Ser: 1.63 mg/dL — ABNORMAL HIGH (ref 0.61–1.24)
GFR, Estimated: 39 mL/min — ABNORMAL LOW (ref >60)
Glucose, Bld: 129 mg/dL — ABNORMAL HIGH (ref 70–99)
Potassium: 5 mmol/L (ref 3.5–5.1)
Sodium: 128 mmol/L — ABNORMAL LOW (ref 135–145)
Total Bilirubin: 0.7 mg/dL (ref 0.3–1.2)
Total Protein: 5.8 g/dL — ABNORMAL LOW (ref 6.5–8.1)

## 2021-01-10 LAB — CEA: CEA: 3.3 ng/mL (ref 0.0–4.7)

## 2021-01-10 LAB — T4, FREE: Free T4: 0.81 ng/dL (ref 0.61–1.12)

## 2021-01-10 LAB — AFP TUMOR MARKER: AFP, Serum, Tumor Marker: 2.6 ng/mL (ref 0.0–6.4)

## 2021-01-10 MED ORDER — LIDOCAINE HCL 1 % IJ SOLN
INTRAMUSCULAR | Status: AC | PRN
  Administered 2021-01-10: 10 mL

## 2021-01-10 MED ORDER — PANTOPRAZOLE SODIUM 40 MG IV SOLR
40.0000 mg | INTRAVENOUS | Status: DC
  Administered 2021-01-10 – 2021-01-11 (×2): 40 mg via INTRAVENOUS
  Filled 2021-01-10 (×2): qty 40

## 2021-01-10 MED ORDER — FENTANYL CITRATE (PF) 100 MCG/2ML IJ SOLN
INTRAMUSCULAR | Status: AC
  Filled 2021-01-10: qty 2

## 2021-01-10 MED ORDER — FENTANYL CITRATE (PF) 100 MCG/2ML IJ SOLN
INTRAMUSCULAR | Status: AC | PRN
  Administered 2021-01-10: 25 ug via INTRAVENOUS

## 2021-01-10 MED ORDER — ALUM & MAG HYDROXIDE-SIMETH 200-200-20 MG/5ML PO SUSP
30.0000 mL | Freq: Four times a day (QID) | ORAL | Status: DC | PRN
  Administered 2021-01-10: 30 mL via ORAL
  Filled 2021-01-10: qty 30

## 2021-01-10 MED ORDER — MIDAZOLAM HCL 2 MG/2ML IJ SOLN
INTRAMUSCULAR | Status: AC
  Filled 2021-01-10: qty 2

## 2021-01-10 MED ORDER — MIDAZOLAM HCL 2 MG/2ML IJ SOLN
INTRAMUSCULAR | Status: AC | PRN
  Administered 2021-01-10: 0.5 mg via INTRAVENOUS

## 2021-01-10 NOTE — Progress Notes (Signed)
Chief Complaint: Patient was seen in consultation today for peritoneal biopsy and paracentesis  Referring Physician(s): Dr. Tana Coast Dr. Lorenso Courier  Supervising Physician: Sandi Mariscal  Patient Status: [facility] - In-pt  History of Present Illness: Stephen Turner is a 87 y.o. 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 He underwent paracentesis on 6/27. Cytology report not yet resulted. However, he c/o worsening abd discomfort and CT imaging finds omental caking with peritoneal masses. He has been admitted for further workup. Oncology consult note reviewed. IR is asked to obtain tissue biopsy. He also has recurrent ascites and paracentesis is requested as well. PMHx, meds, labs, imaging, allergies reviewed. Has been NPO today as directed. Daughter at bedside.   Past Medical History:  Diagnosis Date  . Arthritis   . HLD (hyperlipidemia)   . HTN (hypertension)     Past Surgical History:  Procedure Laterality Date  . APPENDECTOMY    . CARPAL TUNNEL RELEASE Bilateral   . CATARACT EXTRACTION, BILATERAL    . IR PARACENTESIS  01/06/2021  . TONSILLECTOMY      Allergies: Metoprolol  Medications:  Current Facility-Administered Medications:  .  0.9 %  sodium chloride infusion, , Intravenous, Continuous, Agbata, Tochukwu, MD, Last Rate: 75 mL/hr at 01/10/21 0038, New Bag at 01/10/21 0038 .  acetaminophen (TYLENOL) tablet 1,000 mg, 1,000 mg, Oral, Once, Palumbo, April, MD .  alum & mag hydroxide-simeth (MAALOX/MYLANTA) 200-200-20 MG/5ML suspension 30 mL, 30 mL, Oral, Q6H PRN, Blount, Xenia T, NP, 30 mL at 01/10/21 0102 .  amLODipine (NORVASC) tablet 5 mg, 5 mg, Oral, Daily, Agbata, Tochukwu, MD, 5 mg at 01/09/21 1025 .  aspirin EC tablet 81 mg, 81 mg, Oral, Daily, Agbata, Tochukwu, MD, 81 mg at 01/09/21 1025 .  cefTRIAXone (ROCEPHIN) 2 g in sodium chloride 0.9 % 100 mL IVPB, 2 g, Intravenous, Daily, Agbata, Tochukwu, MD, Last Rate: 200 mL/hr at 01/09/21 1028, 2 g at 01/09/21  1028 .  enoxaparin (LOVENOX) injection 30 mg, 30 mg, Subcutaneous, Q24H, Rai, Ripudeep K, MD, 30 mg at 01/09/21 2145 .  mirtazapine (REMERON) tablet 7.5 mg, 7.5 mg, Oral, QHS, Rai, Ripudeep K, MD, 7.5 mg at 01/09/21 2145 .  nitroGLYCERIN (NITROSTAT) SL tablet 0.4 mg, 0.4 mg, Sublingual, Q5 min PRN, Agbata, Tochukwu, MD .  ondansetron (ZOFRAN) tablet 4 mg, 4 mg, Oral, Q6H PRN **OR** ondansetron (ZOFRAN) injection 4 mg, 4 mg, Intravenous, Q6H PRN, Agbata, Tochukwu, MD, 4 mg at 01/09/21 2145 .  pantoprazole (PROTONIX) injection 40 mg, 40 mg, Intravenous, Q24H, Rai, Ripudeep K, MD .  pravastatin (PRAVACHOL) tablet 40 mg, 40 mg, Oral, q1800, Agbata, Tochukwu, MD, 40 mg at 01/09/21 2145 .  traMADol (ULTRAM) tablet 50 mg, 50 mg, Oral, Q8H PRN, Agbata, Tochukwu, MD    Family History  Problem Relation Age of Onset  . Stroke Father   . Leukemia Brother   . Celiac disease Daughter   . COPD Son   . Prostate cancer Brother     Social History   Socioeconomic History  . Marital status: Married    Spouse name: Not on file  . Number of children: 3  . Years of education: Not on file  . Highest education level: Not on file  Occupational History  . Occupation: retired  Tobacco Use  . Smoking status: Former    Packs/day: 0.50    Years: 20.00    Pack years: 10.00    Types: Cigarettes    Quit date: 1968    Years since quitting:  54.5  . Smokeless tobacco: Never  Vaping Use  . Vaping Use: Never used  Substance and Sexual Activity  . Alcohol use: Yes    Comment: 1 per week  . Drug use: No  . Sexual activity: Never  Other Topics Concern  . Not on file  Social History Narrative  . Not on file   Social Determinants of Health   Financial Resource Strain: Not on file  Food Insecurity: Not on file  Transportation Needs: Not on file  Physical Activity: Not on file  Stress: Not on file  Social Connections: Not on file     Review of Systems: A 12 point ROS discussed and pertinent positives  are indicated in the HPI above.  All other systems are negative.  Review of Systems  Vital Signs: BP 124/75 (BP Location: Left Arm)   Pulse 90   Temp 97.7 F (36.5 C) (Oral)   Resp 20   Ht 5\' 4"  (1.626 m)   Wt 73.7 kg   SpO2 93%   BMI 27.89 kg/m   Physical Exam Constitutional:      Appearance: He is well-developed. He is not ill-appearing or toxic-appearing.  HENT:     Mouth/Throat:     Mouth: Mucous membranes are moist.     Pharynx: Oropharynx is clear.  Cardiovascular:     Rate and Rhythm: Normal rate and regular rhythm.     Heart sounds: Normal heart sounds.  Pulmonary:     Effort: Pulmonary effort is normal. No respiratory distress.     Breath sounds: Normal breath sounds.  Abdominal:     General: There is distension.     Palpations: Abdomen is soft.     Tenderness: There is no abdominal tenderness. There is no guarding.  Skin:    General: Skin is warm and dry.  Neurological:     General: No focal deficit present.     Mental Status: He is alert and oriented to person, place, and time.  Psychiatric:        Mood and Affect: Mood normal.        Thought Content: Thought content normal.    Imaging: DG Chest 1 View  Result Date: 01/08/2021 CLINICAL DATA:  Peritonitis, leukocytosis EXAM: CHEST  1 VIEW COMPARISON:  07/09/2020 FINDINGS: Lungs are well expanded, symmetric, and clear. No pneumothorax or pleural effusion. Cardiac size within normal limits. Pulmonary vascularity is normal. Osseous structures are age-appropriate. No acute bone abnormality. IMPRESSION: No active disease. Electronically Signed   By: Fidela Salisbury MD   On: 01/08/2021 00:27   CT CHEST W CONTRAST  Result Date: 01/09/2021 CLINICAL DATA:  Cancer of unknown primary. EXAM: CT CHEST WITH CONTRAST TECHNIQUE: Multidetector CT imaging of the chest was performed during intravenous contrast administration. CONTRAST:  11mL OMNIPAQUE IOHEXOL 300 MG/ML  SOLN COMPARISON:  January 08, 2021. FINDINGS:  Cardiovascular: Atherosclerosis of thoracic aorta is noted without aneurysm or dissection. Normal cardiac size. No pericardial effusion. Mild coronary calcifications are noted. Mediastinum/Nodes: 4 cm right thyroid mass is noted. Mild diffuse esophageal wall thickening is noted suggesting possible esophagitis. No definite mediastinal or axillary adenopathy is noted. Lungs/Pleura: No pneumothorax or pleural effusion is noted. Minimal bibasilar subsegmental atelectasis is noted. Upper Abdomen: As noted on prior exam, ascites is noted as well as omental caking suggesting peritoneal carcinomatosis. 11 x 8 mm lymph node is seen superior to the anterior portion of the right hemidiaphragm concerning for possible metastatic disease. Musculoskeletal: No chest wall abnormality. No acute  or significant osseous findings. IMPRESSION: 4 cm right thyroid mass is noted. Recommend thyroid US. (Ref: J Am Coll Radiol. 2015 Feb;12(2): 143-50). Mild coronary artery calcifications are noted. Mild diffuse esophageal wall thickening is noted suggesting possible esophagitis. Endoscopy is recommended for further evaluation. 11 x 8 mm lymph node is noted superior to the anterior portion of the right hemidiaphragm concerning for possible metastatic disease. Aortic Atherosclerosis (ICD10-I70.0). Electronically Signed   By: Marijo Conception M.D.   On: 01/09/2021 13:58   CT ABDOMEN PELVIS W CONTRAST  Result Date: 01/08/2021 CLINICAL DATA:  Abdominal pain.  Recent paracentesis EXAM: CT ABDOMEN AND PELVIS WITH CONTRAST TECHNIQUE: Multidetector CT imaging of the abdomen and pelvis was performed using the standard protocol following bolus administration of intravenous contrast. CONTRAST:  39mL OMNIPAQUE IOHEXOL 300 MG/ML  SOLN COMPARISON:  None. FINDINGS: Lower chest: No acute abnormality. Hepatobiliary: Small cyst in the inferior right hepatic lobe. No suspicious focal hepatic abnormality. Gallbladder unremarkable. Pancreas: No focal abnormality  or ductal dilatation. Spleen: No focal abnormality.  Normal size. Adrenals/Urinary Tract: Left renal parapelvic cysts. No hydronephrosis. No renal or adrenal mass. Small diverticulum off the posterior left bladder wall. No bladder wall thickening. Stomach/Bowel: Scattered colonic diverticulosis. No bowel obstruction. Stomach and small bowel decompressed, grossly unremarkable. Vascular/Lymphatic: Calcified aorta. No evidence of aneurysm or adenopathy. Reproductive: Mildly prominent prostate. Other: Moderate ascites throughout the abdomen and pelvis. There is omental thickening/caking. Peritoneal mass seen in the left upper quadrant adjacent to the spleen measuring up to 7 cm. Other peritoneal masses adjacent to the splenic flexure of the colon measuring 3.2 cm. Musculoskeletal: No acute bony abnormality. IMPRESSION: Moderate ascites with omental thickening/caking and multiple peritoneal cystic masses. 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 Aortic atherosclerosis. Electronically Signed   By: Rolm Baptise M.D.   On: 01/08/2021 00:47   IR Paracentesis  Result Date: 01/06/2021 INDICATION: Abdominal distention with ascites. Request for therapeutic and diagnostic paracentesis. EXAM: ULTRASOUND GUIDED RIGHT LOWER QUADRANT PARACENTESIS MEDICATIONS: % plain lidocaine, 5 mL COMPLICATIONS: None immediate. PROCEDURE: Informed written consent was obtained from the patient after a discussion of the risks, benefits and alternatives to treatment. A timeout was performed prior to the initiation of the procedure. Initial ultrasound scanning demonstrates a large amount of ascites within the right lower abdominal quadrant. The right lower abdomen was prepped and draped in the usual sterile fashion. 1% lidocaine was used for local anesthesia. Following this, a 6 Fr Safe-T-Centesis catheter was introduced. An ultrasound image was saved for documentation purposes. The paracentesis was performed. The catheter was  removed and a dressing was applied. The patient tolerated the procedure well without immediate post procedural complication. FINDINGS: A total of approximately 4.2 L of hazy, serosanguineous fluid was removed. Samples were sent to the laboratory as requested by the clinical team. IMPRESSION: Successful ultrasound-guided paracentesis yielding 4.2 liters of peritoneal fluid. Read by: Ascencion Dike PA-C Electronically Signed   By: Ruthann Cancer MD   On: 01/06/2021 12:39    Labs:  CBC: Recent Labs    07/08/20 1813 12/21/2020 2047 01/09/21 0540 01/10/21 0513  WBC 6.0 11.7* 12.6* 14.3*  HGB 15.3 17.4* 17.1* 17.1*  HCT 47.4 51.5 52.4* 52.5*  PLT 160 408* 448* 411*    COAGS: No results for input(s): INR, APTT in the last 8760 hours.  BMP: Recent Labs    07/08/20 1813 12/21/2020 2047 01/09/21 0540 01/10/21 0513  NA 138 127* 129* 128*  K 4.1 5.3* 4.8 5.0  CL 102 95*  99 100  CO2 27 18* 18* 14*  GLUCOSE 121* 142* 130* 129*  BUN 33* 73* 83* 90*  CALCIUM 9.1 9.0 8.4* 8.3*  CREATININE 1.04 1.46* 1.67* 1.63*  GFRNONAA >60 44* 37* 39*    LIVER FUNCTION TESTS: Recent Labs    12/22/2020 2047 01/10/21 0513  BILITOT 0.5 0.7  AST 32 27  ALT 25 29  ALKPHOS 92 90  PROT 6.0* 5.8*  ALBUMIN 3.2* 2.7*    TUMOR MARKERS: No results for input(s): AFPTM, CEA, CA199, CHROMGRNA in the last 8760 hours.  Assessment and Plan: Abdominal pain and distention Imaging findings consistent with peritoneal carcinomatosis and ascites. Plan for CT guided biopsy and paracentesis today. Labs reviewed. Risks and benefits of biopsy and paracentesis was discussed with the patient and/or patient's family including, but not limited to bleeding, infection, damage to adjacent structures or low yield requiring additional tests.  All of the questions were answered and there is agreement to proceed.  Consent signed and in chart.   Thank you for this interesting consult.  I greatly enjoyed meeting Stephen Turner and  look forward to participating in their care.  A copy of this report was sent to the requesting provider on this date.  Electronically Signed: Ascencion Dike, PA-C 01/10/2021, 9:41 AM   I spent a total of 25 minutes in face to face in clinical consultation, greater than 50% of which was counseling/coordinating care for biopsy and paracentesis

## 2021-01-10 NOTE — Procedures (Signed)
Pre procedural Dx: Omental mass; concern for malignant ascites Post procedural Dx: Same  Technically successful CT guided biopsy of indeterminate omental mass within the left upper abdominal quadrant. Technically successful CT guided paracentesis yielding 4.4 L of serous fluid.   EBL: None.  Complications: None immediate.   Ronny Bacon, MD Pager #: [phone]

## 2021-01-10 NOTE — Evaluation (Signed)
SLP Cancellation Note  Patient Details Name: Stephen Turner MRN: [id number] DOB: Aug 31, 1922   Cancelled treatment:       Reason Eval/Treat Not Completed: Other (comment);Medical issues which prohibited therapy (pt npo for paracentesis, will continue efforts)  Kathleen Lime, MS [facility] SLP Acute Rehab Services Office [phone] Pager [phone]   Macario Golds 01/10/2021, 11:11 AM

## 2021-01-10 NOTE — Consult Note (Signed)
Consultation Note Date: 01/10/2021   Patient Name: Stephen Turner  DOB: 03/02/1923  MRN: [id number]  Age / Sex: 87 y.o., male  PCP: Leanna Battles, MD Referring Physician: Mendel Corning, MD  Reason for Consultation: Establishing goals of care  HPI/Patient Profile: 87 y.o. male  with past medical history of hypertension, hyperlipidemia, arthritis admitted on 12/28/2020 with poor p.o. intake and abdominal distention.  Concern for SBP and he was been started on antibiotics.  Work-up revealed CT with moderate ascites and carcinomatosis consistent with involvement of the mesentery.  Paracentesis removed 4 L of fluid and cytology from this has been pending.  He was evaluated by oncology who recommended staging scans and biopsy.  Plan is for repeat paracentesis for return distention as well as biopsy later today.  Palliative consulted for goals of care.  I met today with Stephen Turner and his daughter, Webb Silversmith.   I introduced palliative care as specialized medical care for people living with serious illness. It focuses on providing relief from the symptoms and stress of a serious illness. The goal is to improve quality of life for both the patient and the family.  Stephen Turner reports that his family and his faith are the most important things to him.  Most of his day is devoted to taking care of his wife who has history of strokes in the past.  He also has a son and a daughter.  He had another son as well as his first wife who both died under the care of hospice.  We discussed his religious journey and how this is brought him peace and he is work to help others is much as possible throughout his life.  We discussed clinical course as well as wishes moving forward in regard to advanced directives.  We discussed difference between a aggressive medical intervention path and a palliative, comfort focused care path.  Values and goals  of care important to patient and family were attempted to be elicited.   We had a long discussion regarding goals of care and the fact that he likely has peritoneal carcinomatosis.  We discussed advanced cancer and that this is not going to be a "fixable" problem.  We talked about his family and faith being the most important things to him and working to ensure that he is able to enjoy them as much as possible.  He would like to discuss goals and care plan further with Dr. Lorenso Courier once results of biopsy are available and we discussed looking at any potential treatment for his cancer with a question of the likelihood it will add quality time to his life.  We discussed that he may be a poor candidate for disease modifying therapy and this does not mean that there is not care to give, however, it means that focus should be on improving symptom management and therefore functional status is much as possible in light of incurable illness.  He was open and appreciative of conversation.  He feels as though he needs to get  more information about his diagnosis by obtaining biopsy and discussing further with Dr. Lorenso Courier.  Questions and concerns addressed.   PMT will continue to support holistically.   SUMMARY OF RECOMMENDATIONS   -DNR/DNI -Continue current care.  He would like to obtain biopsy for further information about his cancer and discuss options with oncology prior to making final decision.  We did discuss in depth decision making regarding potential for treatment and consideration for electing his hospice benefits moving forward.    Code Status/Advance Care Planning: DNR  Palliative Prophylaxis:  Bowel Regimen and Frequent Pain Assessment  Psycho-social/Spiritual:  Desire for further Chaplaincy support: Did not address today Additional Recommendations: Education on Hospice  Prognosis:  Unable to determine  Discharge Planning: To Be Determined      Primary Diagnoses: Present on Admission:   Abdominal pain  Essential hypertension  Peritoneal carcinomatosis ([place])  Dehydration with hyponatremia  Hyperkalemia  Physical deconditioning   I have reviewed the medical record, interviewed the patient and family, and examined the patient. The following aspects are pertinent.  Past Medical History:  Diagnosis Date   Arthritis    HLD (hyperlipidemia)    HTN (hypertension)    Social History   Socioeconomic History   Marital status: Married    Spouse name: Not on file   Number of children: 3   Years of education: Not on file   Highest education level: Not on file  Occupational History   Occupation: retired  Tobacco Use   Smoking status: Former    Packs/day: 0.50    Years: 20.00    Pack years: 10.00    Types: Cigarettes    Quit date: 1968    Years since quitting: 54.5   Smokeless tobacco: Never  Vaping Use   Vaping Use: Never used  Substance and Sexual Activity   Alcohol use: Yes    Comment: 1 per week   Drug use: No   Sexual activity: Never  Other Topics Concern   Not on file  Social History Narrative   Not on file   Social Determinants of Health   Financial Resource Strain: Not on file  Food Insecurity: Not on file  Transportation Needs: Not on file  Physical Activity: Not on file  Stress: Not on file  Social Connections: Not on file   Family History  Problem Relation Age of Onset   Stroke Father    Leukemia Brother    Celiac disease Daughter    COPD Son    Prostate cancer Brother    Scheduled Meds:  acetaminophen  1,000 mg Oral Once   amLODipine  5 mg Oral Daily   aspirin EC  81 mg Oral Daily   enoxaparin (LOVENOX) injection  30 mg Subcutaneous Q24H   fentaNYL       midazolam       mirtazapine  7.5 mg Oral QHS   pantoprazole (PROTONIX) IV  40 mg Intravenous Q24H   pravastatin  40 mg Oral q1800   Continuous Infusions:  sodium chloride 75 mL/hr at 01/10/21 1712   cefTRIAXone (ROCEPHIN)  IV 2 g (01/10/21 1003)   PRN Meds:.alum & mag  hydroxide-simeth, nitroGLYCERIN, ondansetron **OR** ondansetron (ZOFRAN) IV, traMADol Medications Prior to Admission:  Prior to Admission medications   Medication Sig Start Date End Date Taking? Authorizing Provider  amLODipine (NORVASC) 5 MG tablet Take 5 mg by mouth daily.   Yes [provider]  aspirin EC 81 MG tablet Take 1 tablet (81 mg total) by mouth  daily. Swallow whole. 07/09/20  Yes Reino Bellis B, NP  lovastatin (MEVACOR) 40 MG tablet Take 40 mg by mouth at bedtime.   Yes [provider]  nitroGLYCERIN (NITROSTAT) 0.4 MG SL tablet Place 1 tablet (0.4 mg total) under the tongue every 5 (five) minutes as needed. 07/09/20  Yes Reino Bellis B, NP  triamcinolone cream (KENALOG) 0.1 % Apply 1 application topically in the morning, at noon, and at bedtime. 12/22/19  Yes [provider]   Allergies  Allergen Reactions   Metoprolol Swelling    STOPPED D/T GOUT REACTION IN BOTH THUMBS   Review of Systems  Constitutional:  Positive for activity change and fatigue.  Gastrointestinal:  Positive for abdominal distention, abdominal pain and nausea.  Neurological:  Positive for weakness.  Psychiatric/Behavioral:  Positive for sleep disturbance.    Physical Exam  Vital Signs: BP 108/63 (BP Location: Left Arm)   Pulse 85   Temp 97.7 F (36.5 C) (Oral)   Resp 15   Ht $R'5\' 4"'Eg$  (1.626 m)   Wt 73.7 kg   SpO2 97%   BMI 27.89 kg/m  Pain Scale: Faces POSS *See Group Information*: 1-Acceptable,Awake and alert Pain Score: 0-No pain   SpO2: SpO2: 97 % O2 Device:SpO2: 97 % O2 Flow Rate: .O2 Flow Rate (L/min): 2 L/min  IO: Intake/output summary: No intake or output data in the 24 hours ending 01/10/21 1804  LBM: Last BM Date: 01/09/21 Baseline Weight: Weight: 73.7 kg Most recent weight: Weight: 73.7 kg     Palliative Assessment/Data:   Flowsheet Rows    Flowsheet Row Most Recent Value  Intake Tab   Referral Department Hospitalist  Unit at Time of  Referral Med/Surg Unit  Palliative Care Primary Diagnosis Cancer  Date Notified 01/09/21  Palliative Care Type New Palliative care  Reason for referral Clarify Goals of Care  Date of Admission 12/23/2020  Date first seen by Palliative Care 01/10/21  # of days Palliative referral response time 1 Day(s)  # of days IP prior to Palliative referral 2  Clinical Assessment   Palliative Performance Scale Score 30%  Psychosocial & Spiritual Assessment   Palliative Care Outcomes   Patient/Family meeting held? Yes  Who was at the meeting? Patient       Time In: 1100 Time Out: 1215 Time Total: 75 Greater than 50%  of this time was spent counseling and coordinating care related to the above assessment and plan.  Signed by: Micheline Rough, MD   Please contact Palliative Medicine Team phone at [phone] for questions and concerns.  For individual provider: See Shea Evans

## 2021-01-10 NOTE — Progress Notes (Addendum)
[facility]                                                                              Patient Demographics  Stephen Turner, is a 87 y.o. male, DOB - [birth year], SLH:[id number]  Admit date - 12/31/2020   Admitting Physician Vernelle Emerald, MD  Outpatient Primary MD for the patient is Leanna Battles, MD  Outpatient specialists:   LOS - 2  days   Medical records reviewed and are as summarized below:    Chief Complaint  Patient presents with   Abdominal Pain       Brief summary   Patient is a 87 year old male with hypertension, hyperlipidemia, arthritis presented to ED for increasing abdominal distention, low-grade fevers, poor oral intake and not feeling well.  Patient had a recent paracentesis done on 01/06/2021 prior to the admission and had drained 4.2 L of fluid.  He was contacted and advised to go to the ER due to concerns of possible SBP.  Patient complained of diffuse abdominal pain associated with poor oral intake and decreased urine output.  Patient had low-grade fevers at home, temp of 100 F. CT abdomen pelvis showed moderate ascites with omental thickening/caking and multiple peritoneal cystic masses concerning for peritoneal carcinomatosis Patient was admitted for further work-up  Assessment & Plan    Principal Problem: New onset ascites with concern for SBP -Patient presented with new onset ascites, status post recent paracentesis, low-grade fevers, poor oral intake -Status post paracentesis, was noted to have 80% neutrophils with ascitic fluid turbid, concerning for SBP -Continue IV Rocephin 2 g daily -Repeat paracentesis today, patient feels distended and short of breath, ordered labs and cytology  Active Problems: Peritoneal carcinomatosis, new diagnosis with unknown primary -Oncology consulted, ordered CT chest for further work-up, will need tissue diagnosis -Tumor markers showed CA 19-9 71, PSA 5.95, CEA normal -CT chest showed  right thyroid mass, possible esophagitis.  11x 8 mm lymph node in anterior portion of the right hemidiaphragm concerning for possible metastatic disease -CT-guided biopsy per IR.  Right ultrasound thyroid scan ordered.    Dehydration with hyponatremia, poor oral intake, lactic acidosis -Patient was placed on IV fluid hydration, sodium 128, follows closely     Hyperkalemia -Currently stable  Acute kidney injury -Likely due to peritoneal carcinomatosis, dehydration.  Creatinine stable at 1.6    Physical deconditioning -PT OT evaluation  Insomnia with poor appetite -Continue Remeron  Goals of care Discussed in detail with the patient's daughter at the bedside.  Updated in detail about the current management, imaging and results.  Family would like to continue further work-up however they also are open to the idea of palliative medicine consult.   Code Status: dnr DVT Prophylaxis:  enoxaparin (LOVENOX) injection 30 mg Start: 01/09/21 2200   Level of Care: Level of care: Telemetry Family Communication: Discussed all imaging results, lab results, explained to the patient and daughter at the bedside in detail   Disposition Plan:     Status is: Inpatient  Remains inpatient appropriate because:Inpatient level of care appropriate due to severity of illness  Dispo: The patient is from: Home  Anticipated d/c is to: Home              Patient currently is not medically stable to d/c.  Currently undergoing work-up for and CT-guided biopsy for tissue diagnosis   Difficult to place patient No      Time Spent in minutes   40 minutes  Procedures:    Consultants:   Oncology  Interventional etiology  Antimicrobials:   Anti-infectives (From admission, onward)    Start     Dose/Rate Route Frequency Ordered Stop   01/08/21 1600  cefTRIAXone (ROCEPHIN) 2 g in sodium chloride 0.9 % 100 mL IVPB        2 g 200 mL/hr over 30 Minutes Intravenous Daily 01/08/21 1424      01/08/21 0115  cefTRIAXone (ROCEPHIN) 1 g in sodium chloride 0.9 % 100 mL IVPB  Status:  Discontinued        1 g 200 mL/hr over 30 Minutes Intravenous Every 24 hours 01/08/21 0102 01/08/21 1424          Medications  Scheduled Meds:  acetaminophen  1,000 mg Oral Once   amLODipine  5 mg Oral Daily   aspirin EC  81 mg Oral Daily   enoxaparin (LOVENOX) injection  30 mg Subcutaneous Q24H   fentaNYL       midazolam       mirtazapine  7.5 mg Oral QHS   pantoprazole (PROTONIX) IV  40 mg Intravenous Q24H   pravastatin  40 mg Oral q1800   Continuous Infusions:  sodium chloride 75 mL/hr at 01/10/21 0038   cefTRIAXone (ROCEPHIN)  IV 2 g (01/10/21 1003)   PRN Meds:.alum & mag hydroxide-simeth, nitroGLYCERIN, ondansetron **OR** ondansetron (ZOFRAN) IV, traMADol      Subjective:   [name] was seen and examined today.  Complaining of abdominal distention and difficulty sleeping and breathing due to distention.  Poor p.o. intake.  Generalized weakness.  Daughter at bedside.   Objective:   Vitals:   01/10/21 1340 01/10/21 1355 01/10/21 1400 01/10/21 1405  BP: 115/61 116/72 108/69 108/63  Pulse: 86 85 86 85  Resp: 20 18 20 15   Temp:      TempSrc:      SpO2: 94% 97% 98% 97%  Weight:      Height:       No intake or output data in the 24 hours ending 01/10/21 1449    Wt Readings from Last 3 Encounters:  01/09/21 73.7 kg  07/23/20 75.4 kg  07/09/20 73.7 kg   Physical Exam General: Alert and oriented x 3, NAD Cardiovascular: S1 S2 clear, RRR. No pedal edema b/l Respiratory: CTA B Gastrointestinal: Soft, nontender, distended, NBS Ext: no pedal edema bilaterally Neuro: no new deficits Psych: Normal affect and demeanor, alert and oriented x3    Data Reviewed:  I have personally reviewed following labs and imaging studies  Micro Results Recent Results (from the past 240 hour(s))  Acid Fast Smear (AFB)     Status: None   Collection Time: 01/06/21 12:26 PM    Specimen: Abdomen; Peritoneal Fluid  Result Value Ref Range Status   AFB Specimen Processing Concentration  Final   Acid Fast Smear Negative  Final    Comment: (NOTE) Performed At: [facility] [address] [place], [place] [id number] Rush Farmer MD PI:[id number]    Source (AFB) ABDOMEN  Final    Comment: Performed at [facility], [place] [address] [place], [place] [address]  Blood culture (routine x  2)     Status: None (Preliminary result)   Collection Time: 01/08/21 12:30 AM   Specimen: BLOOD  Result Value Ref Range Status   Specimen Description   Final    BLOOD BOTTLES DRAWN AEROBIC AND ANAEROBIC Performed at [facility], [address], [place], [place] [address]    Special Requests   Final    BLOOD RIGHT HAND Blood Culture adequate volume Performed at [facility], [address], [place], [place] [address]    Culture   Final    NO GROWTH 2 DAYS Performed at [facility], [place] [address], [place], [place] [address]    Report Status PENDING  Incomplete  Blood culture (routine x 2)     Status: None (Preliminary result)   Collection Time: 01/08/21 12:30 AM   Specimen: BLOOD  Result Value Ref Range Status   Specimen Description   Final    BLOOD BOTTLES DRAWN AEROBIC AND ANAEROBIC Performed at [facility], [address], [place], [place] [address]    Special Requests   Final    LEFT ANTECUBITAL Blood Culture adequate volume Performed at [facility], [address], [place], [place] [address]    Culture   Final    NO GROWTH 2 DAYS Performed at [facility], [place] [address], [place],  [address]    Report Status PENDING  Incomplete  Resp Panel by RT-PCR (Flu A&B, Covid) Nasopharyngeal Swab     Status: None   Collection Time: 01/08/21  1:27 AM   Specimen: Nasopharyngeal Swab; Nasopharyngeal(NP) swabs in vial transport medium  Result Value Ref Range Status    SARS Coronavirus 2 by RT PCR NEGATIVE NEGATIVE Final    Comment: (NOTE) SARS-CoV-2 target nucleic acids are NOT DETECTED.  The SARS-CoV-2 RNA is generally detectable in upper respiratory specimens during the acute phase of infection. The lowest concentration of SARS-CoV-2 viral copies this assay can detect is 138 copies/mL. A negative result does not preclude SARS-Cov-2 infection and should not be used as the sole basis for treatment or other patient management decisions. A negative result may occur with  improper specimen collection/handling, submission of specimen other than nasopharyngeal swab, presence of viral mutation(s) within the areas targeted by this assay, and inadequate number of viral copies(<138 copies/mL). A negative result must be combined with clinical observations, patient history, and epidemiological information. The expected result is Negative.  Fact Sheet for Patients:  EntrepreneurPulse.com.au  Fact Sheet for Healthcare Providers:  IncredibleEmployment.be  This test is no t yet approved or cleared by the [place] FDA and  has been authorized for detection and/or diagnosis of SARS-CoV-2 by FDA under an Emergency Use Authorization (EUA). This EUA will remain  in effect (meaning this test can be used) for the duration of the COVID-19 declaration under Section 564(b)(1) of the Act, 21 U.S.C.section 360bbb-3(b)(1), unless the authorization is terminated  or revoked sooner.       Influenza A by PCR NEGATIVE NEGATIVE Final   Influenza B by PCR NEGATIVE NEGATIVE Final    Comment: (NOTE) The Xpert Xpress SARS-CoV-2/FLU/RSV plus assay is intended as an aid in the diagnosis of influenza from Nasopharyngeal swab specimens and should not be used as a sole basis for treatment. Nasal washings and aspirates are unacceptable for Xpert Xpress SARS-CoV-2/FLU/RSV testing.  Fact Sheet for  Patients: EntrepreneurPulse.com.au  Fact Sheet for Healthcare Providers: IncredibleEmployment.be  This test is not yet approved or cleared by the [place] FDA  and has been authorized for detection and/or diagnosis of SARS-CoV-2 by FDA under an Emergency Use Authorization (EUA). This EUA will remain in effect (meaning this test can be used) for the duration of the COVID-19 declaration under Section 564(b)(1) of the Act, 21 U.S.C. section 360bbb-3(b)(1), unless the authorization is terminated or revoked.  Performed at [facility], [address] [place], [place] [address]     Radiology Reports DG Chest 1 View  Result Date: 01/08/2021 CLINICAL DATA:  Peritonitis, leukocytosis EXAM: CHEST  1 VIEW COMPARISON:  07/09/2020 FINDINGS: Lungs are well expanded, symmetric, and clear. No pneumothorax or pleural effusion. Cardiac size within normal limits. Pulmonary vascularity is normal. Osseous structures are age-appropriate. No acute bone abnormality. IMPRESSION: No active disease. Electronically Signed   By: Fidela Salisbury MD   On: 01/08/2021 00:27   CT CHEST W CONTRAST  Result Date: 01/09/2021 CLINICAL DATA:  Cancer of unknown primary. EXAM: CT CHEST WITH CONTRAST TECHNIQUE: Multidetector CT imaging of the chest was performed during intravenous contrast administration. CONTRAST:  50mL OMNIPAQUE IOHEXOL 300 MG/ML  SOLN COMPARISON:  January 08, 2021. FINDINGS: Cardiovascular: Atherosclerosis of thoracic aorta is noted without aneurysm or dissection. Normal cardiac size. No pericardial effusion. Mild coronary calcifications are noted. Mediastinum/Nodes: 4 cm right thyroid mass is noted. Mild diffuse esophageal wall thickening is noted suggesting possible esophagitis. No definite mediastinal or axillary adenopathy is noted. Lungs/Pleura: No pneumothorax or pleural effusion is noted. Minimal bibasilar subsegmental atelectasis is noted. Upper  Abdomen: As noted on prior exam, ascites is noted as well as omental caking suggesting peritoneal carcinomatosis. 11 x 8 mm lymph node is seen superior to the anterior portion of the right hemidiaphragm concerning for possible metastatic disease. Musculoskeletal: No chest wall abnormality. No acute or significant osseous findings. IMPRESSION: 4 cm right thyroid mass is noted. Recommend thyroid US. (Ref: J Am Coll Radiol. 2015 Feb;12(2): 143-50). Mild coronary artery calcifications are noted. Mild diffuse esophageal wall thickening is noted suggesting possible esophagitis. Endoscopy is recommended for further evaluation. 11 x 8 mm lymph node is noted superior to the anterior portion of the right hemidiaphragm concerning for possible metastatic disease. Aortic Atherosclerosis (ICD10-I70.0). Electronically Signed   By: Marijo Conception M.D.   On: 01/09/2021 13:58   CT ABDOMEN PELVIS W CONTRAST  Result Date: 01/08/2021 CLINICAL DATA:  Abdominal pain.  Recent paracentesis EXAM: CT ABDOMEN AND PELVIS WITH CONTRAST TECHNIQUE: Multidetector CT imaging of the abdomen and pelvis was performed using the standard protocol following bolus administration of intravenous contrast. CONTRAST:  86mL OMNIPAQUE IOHEXOL 300 MG/ML  SOLN COMPARISON:  None. FINDINGS: Lower chest: No acute abnormality. Hepatobiliary: Small cyst in the inferior right hepatic lobe. No suspicious focal hepatic abnormality. Gallbladder unremarkable. Pancreas: No focal abnormality or ductal dilatation. Spleen: No focal abnormality.  Normal size. Adrenals/Urinary Tract: Left renal parapelvic cysts. No hydronephrosis. No renal or adrenal mass. Small diverticulum off the posterior left bladder wall. No bladder wall thickening. Stomach/Bowel: Scattered colonic diverticulosis. No bowel obstruction. Stomach and small bowel decompressed, grossly unremarkable. Vascular/Lymphatic: Calcified aorta. No evidence of aneurysm or adenopathy. Reproductive: Mildly prominent  prostate. Other: Moderate ascites throughout the abdomen and pelvis. There is omental thickening/caking. Peritoneal mass seen in the left upper quadrant adjacent to the spleen measuring up to 7 cm. Other peritoneal masses adjacent to the splenic flexure of the colon measuring 3.2 cm. Musculoskeletal: No acute bony abnormality. IMPRESSION: Moderate ascites with omental thickening/caking and multiple peritoneal cystic masses. 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  Aortic atherosclerosis. Electronically Signed   By: Rolm Baptise M.D.   On: 01/08/2021 00:47   US THYROID  Result Date: 01/10/2021 CLINICAL DATA:  Incidental on CT. EXAM: THYROID ULTRASOUND TECHNIQUE: Ultrasound examination of the thyroid gland and adjacent soft tissues was performed. COMPARISON:  None. FINDINGS: Parenchymal Echotexture: Mildly heterogenous Isthmus: 0.4 cm Right lobe: 4.5 x 3.1 x 3.0 cm Left lobe: 2.9 x 1.5 x 1.0 cm _________________________________________________________ Estimated total number of nodules >/= 1 cm: 1 Number of spongiform nodules >/=  2 cm not described below (TR1): 0 Number of mixed cystic and solid nodules >/= 1.5 cm not described below (TR2): 0 _________________________________________________________ Nodule # 1: Location: Right; mid Maximum size: 3.7 cm; Other 2 dimensions: 2.9 x 3.5 cm Composition: solid/almost completely solid (2) Echogenicity: isoechoic (1) Shape: taller-than-wide (3) Margins: ill-defined (0) Echogenic foci: none (0) ACR TI-RADS total points: 6. ACR TI-RADS risk category: TR4 (4-6 points). ACR TI-RADS recommendations: **Given size (>/= 1.5 cm) and appearance, fine needle aspiration of this moderately suspicious nodule should be considered based on TI-RADS criteria. _________________________________________________________ Subcentimeter left thyroid nodule does not meet criteria for FNA or surveillance. IMPRESSION: Nodule 1 (TI-RADS 4) located in the mid right thyroid lobe should be  further evaluated with FNA. The above is in keeping with the ACR TI-RADS recommendations - J Am Coll Radiol 2017;14:587-595. Electronically Signed   By: Miachel Roux M.D.   On: 01/10/2021 11:41   IR Paracentesis  Result Date: 01/06/2021 INDICATION: Abdominal distention with ascites. Request for therapeutic and diagnostic paracentesis. EXAM: ULTRASOUND GUIDED RIGHT LOWER QUADRANT PARACENTESIS MEDICATIONS: % plain lidocaine, 5 mL COMPLICATIONS: None immediate. PROCEDURE: Informed written consent was obtained from the patient after a discussion of the risks, benefits and alternatives to treatment. A timeout was performed prior to the initiation of the procedure. Initial ultrasound scanning demonstrates a large amount of ascites within the right lower abdominal quadrant. The right lower abdomen was prepped and draped in the usual sterile fashion. 1% lidocaine was used for local anesthesia. Following this, a 6 Fr Safe-T-Centesis catheter was introduced. An ultrasound image was saved for documentation purposes. The paracentesis was performed. The catheter was removed and a dressing was applied. The patient tolerated the procedure well without immediate post procedural complication. FINDINGS: A total of approximately 4.2 L of hazy, serosanguineous fluid was removed. Samples were sent to the laboratory as requested by the clinical team. IMPRESSION: Successful ultrasound-guided paracentesis yielding 4.2 liters of peritoneal fluid. Read by: Ascencion Dike PA-C Electronically Signed   By: Ruthann Cancer MD   On: 01/06/2021 12:39    Lab Data:  CBC: Recent Labs  Lab 01/06/2021 2047 01/09/21 0540 01/10/21 0513  WBC 11.7* 12.6* 14.3*  NEUTROABS 9.5*  --   --   HGB 17.4* 17.1* 17.1*  HCT 51.5 52.4* 52.5*  MCV 90.2 93.4 94.3  PLT 408* 448* 621*   Basic Metabolic Panel: Recent Labs  Lab 01/08/2021 2047 01/09/21 0540 01/10/21 0513  NA 127* 129* 128*  K 5.3* 4.8 5.0  CL 95* 99 100  CO2 18* 18* 14*  GLUCOSE 142*  130* 129*  BUN 73* 83* 90*  CREATININE 1.46* 1.67* 1.63*  CALCIUM 9.0 8.4* 8.3*   GFR: Estimated Creatinine Clearance: 24.9 mL/min (A) (by C-G formula based on SCr of 1.63 mg/dL (H)). Liver Function Tests: Recent Labs  Lab 12/15/2020 2047 01/10/21 0513  AST 32 27  ALT 25 29  ALKPHOS 92 90  BILITOT 0.5 0.7  PROT 6.0* 5.8*  ALBUMIN 3.2* 2.7*   No results for input(s): LIPASE, AMYLASE in the last 168 hours. No results for input(s): AMMONIA in the last 168 hours. Coagulation Profile: No results for input(s): INR, PROTIME in the last 168 hours. Cardiac Enzymes: No results for input(s): CKTOTAL, CKMB, CKMBINDEX, TROPONINI in the last 168 hours. BNP (last 3 results) No results for input(s): PROBNP in the last 8760 hours. HbA1C: No results for input(s): HGBA1C in the last 72 hours. CBG: No results for input(s): GLUCAP in the last 168 hours. Lipid Profile: No results for input(s): CHOL, HDL, LDLCALC, TRIG, CHOLHDL, LDLDIRECT in the last 72 hours. Thyroid Function Tests: Recent Labs    01/10/21 0936  FREET4 0.81   Anemia Panel: No results for input(s): VITAMINB12, FOLATE, FERRITIN, TIBC, IRON, RETICCTPCT in the last 72 hours. Urine analysis:    Component Value Date/Time   COLORURINE YELLOW 01/08/2021 0625   APPEARANCEUR CLEAR 01/08/2021 0625   LABSPEC >1.046 (H) 01/08/2021 0625   PHURINE 6.0 01/08/2021 0625   GLUCOSEU NEGATIVE 01/08/2021 0625   HGBUR NEGATIVE 01/08/2021 0625   BILIRUBINUR NEGATIVE 01/08/2021 0625   KETONESUR 15 (A) 01/08/2021 0625   PROTEINUR 30 (A) 01/08/2021 0625   NITRITE NEGATIVE 01/08/2021 0625   LEUKOCYTESUR NEGATIVE 01/08/2021 0625     Neiva Maenza M.D. [facility] 01/10/2021, 2:49 PM  Available via Epic secure chat 7am-7pm After 7 pm, please refer to night coverage provider listed on amion.

## 2021-01-10 DEATH — deceased

## 2021-01-11 DIAGNOSIS — R188 Other ascites: Secondary | ICD-10-CM | POA: Diagnosis not present

## 2021-01-11 DIAGNOSIS — N179 Acute kidney failure, unspecified: Secondary | ICD-10-CM | POA: Diagnosis not present

## 2021-01-11 DIAGNOSIS — C786 Secondary malignant neoplasm of retroperitoneum and peritoneum: Secondary | ICD-10-CM | POA: Diagnosis not present

## 2021-01-11 DIAGNOSIS — R1011 Right upper quadrant pain: Secondary | ICD-10-CM

## 2021-01-11 DIAGNOSIS — Z7189 Other specified counseling: Secondary | ICD-10-CM | POA: Diagnosis not present

## 2021-01-11 DIAGNOSIS — R180 Malignant ascites: Secondary | ICD-10-CM | POA: Diagnosis not present

## 2021-01-11 DIAGNOSIS — Z515 Encounter for palliative care: Secondary | ICD-10-CM | POA: Diagnosis not present

## 2021-01-11 LAB — CBC
HCT: 51.1 % (ref 39.0–52.0)
Hemoglobin: 16.8 g/dL (ref 13.0–17.0)
MCH: 30.5 pg (ref 26.0–34.0)
MCHC: 32.9 g/dL (ref 30.0–36.0)
MCV: 92.7 fL (ref 80.0–100.0)
Platelets: 399 10*3/uL (ref 150–400)
RBC: 5.51 MIL/uL (ref 4.22–5.81)
RDW: 13.2 % (ref 11.5–15.5)
WBC: 16.6 10*3/uL — ABNORMAL HIGH (ref 4.0–10.5)
nRBC: 0 % (ref 0.0–0.2)

## 2021-01-11 LAB — BASIC METABOLIC PANEL
Anion gap: 11 (ref 5–15)
BUN: 84 mg/dL — ABNORMAL HIGH (ref 8–23)
CO2: 18 mmol/L — ABNORMAL LOW (ref 22–32)
Calcium: 8.4 mg/dL — ABNORMAL LOW (ref 8.9–10.3)
Chloride: 99 mmol/L (ref 98–111)
Creatinine, Ser: 1.56 mg/dL — ABNORMAL HIGH (ref 0.61–1.24)
GFR, Estimated: 41 mL/min — ABNORMAL LOW (ref >60)
Glucose, Bld: 123 mg/dL — ABNORMAL HIGH (ref 70–99)
Potassium: 4.5 mmol/L (ref 3.5–5.1)
Sodium: 128 mmol/L — ABNORMAL LOW (ref 135–145)

## 2021-01-11 LAB — TSH: TSH: 2.849 u[IU]/mL (ref 0.350–4.500)

## 2021-01-11 LAB — T3: T3, Total: 63 ng/dL — ABNORMAL LOW (ref 71–180)

## 2021-01-11 MED ORDER — PANTOPRAZOLE SODIUM 40 MG PO TBEC
40.0000 mg | DELAYED_RELEASE_TABLET | Freq: Every day | ORAL | Status: DC
  Administered 2021-01-12: 40 mg via ORAL
  Filled 2021-01-11: qty 1

## 2021-01-11 NOTE — Progress Notes (Signed)
Daily Progress Note   Patient Name: Stephen Turner       Date: 01/11/2021 DOB: [birth year]  Age: 87 y.o. MRN#: [id number] Attending Physician: Stephen Corning, MD Primary Care Physician: Stephen Battles, MD Admit Date: 01/02/2021  Reason for Consultation/Follow-up: Establishing goals of care  Subjective: I met today with Stephen Turner, his son, and his daughter.  He appears to be weak but is able to fully participate in conversation.  Family reports that he was sitting up in chair for several hours today and also has not been sleeping well which is why they believe he may be sleepy at this time.  We discussed his clinical course and options for care moving forward.  Currently, he feels that he needs to know results of biopsy and talk with oncology prior to determine consideration for pursuing further interventions for this cancer versus focusing on aggressive symptom management and electing his hospice benefits.  Per family request, we reviewed options for his care at discharge including: 1) Home with home health-his wife is also debilitated and concern about how he will be able to be cared for safely in this environment 2) Home with hospice-even with assistance of hospice there is concern about ability to safely care for him with current level of help at home.  Family to explore if increasing help may be a possibility.  He is reported to have a long-term care policy that they are going to look for to see what benefits he may be eligible for. 3) Residential hospice-currently I do not think that he is a candidate.  I believe his prognosis is likely greater than 2 weeks and hospice has confirmed to transition to inpatient hospice for respite care from the hospital is not a possibility. 4) long-term care  placement with or without hospice support-remove board fee would be cost prohibitive (although he does have a long-term care policy) and patient is clear he wants to be home with his wife 5) trial of short-term rehab-concerned about his ability to participate consistently in rehab.  Family does point out that he was functional 10 days ago and question if we can really determine that he would would not make gains if he were given the opportunity for rehab.  Symptomatically, he reports feeling better after having paracentesis today.  Family questioning if Pleurx  catheter may be a possibility.  See below.  Length of Stay: 3  Current Medications: Scheduled Meds:   acetaminophen  1,000 mg Oral Once   amLODipine  5 mg Oral Daily   aspirin EC  81 mg Oral Daily   enoxaparin (LOVENOX) injection  30 mg Subcutaneous Q24H   mirtazapine  7.5 mg Oral QHS   [START ON 01/12/2021] pantoprazole  40 mg Oral Daily   pravastatin  40 mg Oral q1800    Continuous Infusions:  sodium chloride 75 mL/hr at 01/11/21 1500   cefTRIAXone (ROCEPHIN)  IV Stopped (01/11/21 1012)    PRN Meds: alum & mag hydroxide-simeth, nitroGLYCERIN, ondansetron **OR** ondansetron (ZOFRAN) IV, traMADol  Physical Exam         General: Sleepy but awake, in no acute distress.   HEENT: No bruits, no goiter, no JVD Heart: Regular rate and rhythm. No murmur appreciated. Lungs: Fair air movement, clear Abdomen: Globally tender, distended, positive bowel sounds.   Skin: Warm and dry Neuro: Grossly intact, nonfocal.   Vital Signs: BP 100/68 (BP Location: Left Arm)   Pulse 85   Temp 97.7 F (36.5 C) (Oral)   Resp 14   Ht $R'5\' 4"'Ry$  (1.626 m)   Wt 73.7 kg   SpO2 92%   BMI 27.89 kg/m  SpO2: SpO2: 92 % O2 Device: O2 Device: Room Air O2 Flow Rate: O2 Flow Rate (L/min): 2 L/min  Intake/output summary:  Intake/Output Summary (Last 24 hours) at 01/11/2021 2121 Last data filed at 01/11/2021 1500 Gross per 24 hour  Intake 2029.95 ml   Output --  Net 2029.95 ml   LBM: Last BM Date: 01/10/21 Baseline Weight: Weight: 73.7 kg Most recent weight: Weight: 73.7 kg       Palliative Assessment/Data:    Flowsheet Rows    Flowsheet Row Most Recent Value  Intake Tab   Referral Department Hospitalist  Unit at Time of Referral Med/Surg Unit  Palliative Care Primary Diagnosis Cancer  Date Notified 01/09/21  Palliative Care Type New Palliative care  Reason for referral Clarify Goals of Care  Date of Admission 12/27/2020  Date first seen by Palliative Care 01/10/21  # of days Palliative referral response time 1 Day(s)  # of days IP prior to Palliative referral 2  Clinical Assessment   Palliative Performance Scale Score 30%  Psychosocial & Spiritual Assessment   Palliative Care Outcomes   Patient/Family meeting held? Yes  Who was at the meeting? Patient       Patient Active Problem List   Diagnosis Date Noted   Abdominal pain 01/08/2021   SBP (spontaneous bacterial peritonitis) ([place]) 01/08/2021   Peritoneal carcinomatosis ([place]) 01/08/2021   Dehydration with hyponatremia 01/08/2021   Hyperkalemia 01/08/2021   Physical deconditioning 01/08/2021   Precordial pain    Angina pectoris ([place])    Hyperlipidemia with target low density lipoprotein (LDL) cholesterol less than 70 mg/dL    Hoarseness 01/04/2020   Throat clearing 01/04/2020   Gastroesophageal reflux disease 01/04/2020   Essential hypertension 11/01/2007   PERSONAL HISTORY OF COLONIC POLYPS 11/01/2007    Palliative Care Assessment & Plan   Patient Profile: 87 year old male with past medical history hypertension, hyperlipidemia, arthritis who was admitted with poor intake and abdominal distention with concern for SBP and found to have imaging consistent with carcinomatosis involving the mesentery.  He is status postbiopsy and results are pending.  Palliative consulted for goals of care.  Recommendations/Plan: Discussed again with Stephen Turner and his  children that he  has advanced cancer that is not going to be cured and is likely to be a poor candidate for systemic therapy.  Await results of biopsy.  He reports needing to know the results of these and discussing with oncology prior to making a final decision about his goals (trying to pursue any sort of treatment versus electing his hospice benefits). Family is familiar with hospice from prior encounters with family.  I did discuss his case with [facility] liaison per family request to clarify if he could go to residential hospice for respite care from the hospital.  Hospice liaison relayed that it is no longer an option to transfer to [facility] for respite care directly from the hospital. Family requesting information about if Pleurx catheter may be a possibility as he feels much better after getting paracentesis.  I agree he would symptomatically feel better and it would make management of his ascites easier with Pleurx catheter.  I am not sure, however, if this is technically possible with his peritoneal carcinomatosis and concern for recurrence of SBP.   We discussed options for care including consideration for hospice.  Part of the concern is his current functional status and inability to care for himself due to significant debility at this time.  His son is going to look for long-term care policy that he had purchased to see if they may be able to get additional assistance in the home through this.  Family also question if he may benefit from short-term trial of rehab.  They understand concerned about how much rehab potential he has, but also point out that he was very functional 10 days prior to admission and question if he would benefit from trial of rehab Will continue to discuss disposition.  Family would like to review his long-term care policy to determine realistically what help they be in the home.  We are planning to follow-up tomorrow at 1 PM.  Code Status:    Code Status Orders   (From admission, onward)           Start     Ordered   01/08/21 1423  Do not attempt resuscitation (DNR)  Continuous       Question Answer Comment  In the event of cardiac or respiratory ARREST Do not call a "code blue"   In the event of cardiac or respiratory ARREST Do not perform Intubation, CPR, defibrillation or ACLS   In the event of cardiac or respiratory ARREST Use medication by any route, position, wound care, and other measures to relive pain and suffering. May use oxygen, suction and manual treatment of airway obstruction as needed for comfort.   Comments Patient requests DNR status      01/08/21 1424           Code Status History     This patient has a current code status but no historical code status.       Prognosis:  < 6 months  Discharge Planning: To Be Determined  Care plan was discussed with patient, son, daughter  Thank you for allowing the Palliative Medicine Team to assist in the care of this patient.   Time In: 1600 Time Out: 1655 Total Time 55 Prolonged Time Billed No      Greater than 50%  of this time was spent counseling and coordinating care related to the above assessment and plan.  Micheline Rough, MD  Please contact Palliative Medicine Team phone at [phone] for questions and concerns.

## 2021-01-11 NOTE — Progress Notes (Signed)
[facility]                                                                              Patient Demographics  Stephen Turner, is a 87 y.o. male, DOB - May 22, 1923, FMB:[id number]  Admit date - 01/06/2021   Admitting Physician Vernelle Emerald, MD  Outpatient Primary MD for the patient is Leanna Battles, MD  Outpatient specialists:   LOS - 3  days   Medical records reviewed and are as summarized below:    Chief Complaint  Patient presents with   Abdominal Pain       Brief summary   Patient is a 87 year old male with hypertension, hyperlipidemia, arthritis presented to ED for increasing abdominal distention, low-grade fevers, poor oral intake and not feeling well.  Patient had a recent paracentesis done on 01/06/2021 prior to the admission and had drained 4.2 L of fluid.  He was contacted and advised to go to the ER due to concerns of possible SBP.  Patient complained of diffuse abdominal pain associated with poor oral intake and decreased urine output.  Patient had low-grade fevers at home, temp of 100 F. CT abdomen pelvis showed moderate ascites with omental thickening/caking and multiple peritoneal cystic masses concerning for peritoneal carcinomatosis Patient was admitted for further work-up  Assessment & Plan    Principal Problem: New onset ascites with concern for SBP -Patient presented with new onset ascites, status post recent paracentesis on 6/27, low-grade fevers, poor oral intake -Status post paracentesis, was noted to have 80% neutrophils with ascitic fluid turbid, concerning for SBP -Continue IV Rocephin 2 g daily -Underwent paracentesis x2 on 7/1, drained 4.4 L, 88% neutrophils, follow cytology -At this point option, would be palliative paracentesis for comfort or if peritoneal catheter drain possible (unclear if that it is even feasible due to peritoneal carcinomatosis)  Active Problems: Peritoneal carcinomatosis, new diagnosis with unknown  primary, 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 -Tumor markers showed CA 19-9 71, PSA 5.95, CEA normal -CT chest showed right thyroid mass, possible esophagitis.  11x 8 mm lymph node in anterior portion of the right hemidiaphragm concerning for possible metastatic disease -Underwent CT-guided biopsy per IR on 7/1.  Biopsy results pending  -Right thyroid lobe with 3.7 cm nodule, recommended FNAC given size> 1.5 cm.  Thyroid function fairly stable, palliative goals of care meeting in process regarding hospice.    Dehydration with hyponatremia, poor oral intake, lactic acidosis -Sodium stable 128    Hyperkalemia -Potassium stable  Acute kidney injury -Likely due to peritoneal carcinomatosis, dehydration.  -Creatinine 1.5    Physical deconditioning -PT OT evaluation  Insomnia with poor appetite -Continue Remeron  Goals of care Palliative meeting in process, daughter requesting [facility] for respite care.  Palliative medicine, Dr. Domingo Cocking aware, will check with [facility].   Code Status: dnr DVT Prophylaxis:  enoxaparin (LOVENOX) injection 30 mg Start: 01/09/21 2200   Level of Care: Level of care: Telemetry Family Communication: Discussed all imaging results, lab results, explained to the patient and daughter at the bedside in detail   Disposition Plan:     Status is: Inpatient  Remains inpatient  appropriate because:Inpatient level of care appropriate due to severity of illness  Dispo: The patient is from: Home              Anticipated d/c is to: Home              Patient currently is not medically stable to d/c.  Biopsy results pending, also palliative medicine following for possible hospice and [facility]   Difficult to place patient No      Time Spent in minutes 35 minutes  Procedures:  CT-guided biopsy of indeterminate omental mass in the left upper abdominal quadrant Paracentesis 7/1  Consultants:   Oncology  Interventional etiology Palliative  medicine  Antimicrobials:   Anti-infectives (From admission, onward)    Start     Dose/Rate Route Frequency Ordered Stop   01/08/21 1600  cefTRIAXone (ROCEPHIN) 2 g in sodium chloride 0.9 % 100 mL IVPB        2 g 200 mL/hr over 30 Minutes Intravenous Daily 01/08/21 1424     01/08/21 0115  cefTRIAXone (ROCEPHIN) 1 g in sodium chloride 0.9 % 100 mL IVPB  Status:  Discontinued        1 g 200 mL/hr over 30 Minutes Intravenous Every 24 hours 01/08/21 0102 01/08/21 1424          Medications  Scheduled Meds:  acetaminophen  1,000 mg Oral Once   amLODipine  5 mg Oral Daily   aspirin EC  81 mg Oral Daily   enoxaparin (LOVENOX) injection  30 mg Subcutaneous Q24H   mirtazapine  7.5 mg Oral QHS   pantoprazole (PROTONIX) IV  40 mg Intravenous Q24H   pravastatin  40 mg Oral q1800   Continuous Infusions:  sodium chloride 75 mL/hr at 01/11/21 0951   cefTRIAXone (ROCEPHIN)  IV 2 g (01/11/21 0941)   PRN Meds:.alum & mag hydroxide-simeth, nitroGLYCERIN, ondansetron **OR** ondansetron (ZOFRAN) IV, traMADol      Subjective:   Stephen Turner was seen and examined today.  Was nauseous and threw up x1 yesterday.  Slept through 3 PM had a BM this morning.  Poor p.o. intake.  Generalized weakness.  Daughter at bedside.   Objective:   Vitals:   01/10/21 1400 01/10/21 1405 01/10/21 2041 01/11/21 0358  BP: 108/69 108/63 112/64 118/72  Pulse: 86 85 86 87  Resp: 20 15 18 16   Temp:   98.5 F (36.9 C) (!) 97.5 F (36.4 C)  TempSrc:   Oral Oral  SpO2: 98% 97% 92% 94%  Weight:      Height:        Intake/Output Summary (Last 24 hours) at 01/11/2021 1304 Last data filed at 01/11/2021 0656 Gross per 24 hour  Intake 900 ml  Output --  Net 900 ml      Wt Readings from Last 3 Encounters:  01/09/21 73.7 kg  07/23/20 75.4 kg  07/09/20 73.7 kg   Physical Exam General: Alert and oriented x 3, ill-appearing Cardiovascular: S1 S2 clear, RRR. No pedal edema b/l Respiratory: CTAB, no  wheezing, rales or rhonchi Gastrointestinal: Soft, nontender, ascites, NBS Ext: no pedal edema bilaterally Psych: Normal affect and demeanor   Data Reviewed:  I have personally reviewed following labs and imaging studies  Micro Results Recent Results (from the past 240 hour(s))  Acid Fast Smear (AFB)     Status: None   Collection Time: 01/06/21 12:26 PM   Specimen: Abdomen; Peritoneal Fluid  Result Value Ref Range Status   AFB Specimen Processing Concentration  Final   Acid Fast Smear Negative  Final    Comment: (NOTE) Performed At: [facility], [place] [id number] Rush Farmer MD [id number]    Source (AFB) ABDOMEN  Final    Comment: Performed at [facility], [place] [address], [place], [place] [address]  Blood culture (routine x 2)     Status: None (Preliminary result)   Collection Time: 01/08/21 12:30 AM   Specimen: BLOOD  Result Value Ref Range Status   Specimen Description   Final    BLOOD BOTTLES DRAWN AEROBIC AND ANAEROBIC Performed at [facility], [address] [place], [place] [address]    Special Requests   Final    BLOOD RIGHT HAND Blood Culture adequate volume Performed at [facility], [address], [place], [place] [address]    Culture   Final    NO GROWTH 3 DAYS Performed at [facility], [place] [address] [place], [place] [address]    Report Status PENDING  Incomplete  Blood culture (routine x 2)     Status: None (Preliminary result)   Collection Time: 01/08/21 12:30 AM   Specimen: BLOOD  Result Value Ref Range Status   Specimen Description   Final    BLOOD BOTTLES DRAWN AEROBIC AND ANAEROBIC Performed at [facility], [address], [place], [place] [address]    Special Requests   Final    LEFT ANTECUBITAL Blood Culture adequate volume Performed at [facility], [address] [place], [place] [address]    Culture    Final    NO GROWTH 3 DAYS Performed at [facility], [place] [address], [place], [place] [address]    Report Status PENDING  Incomplete  Resp Panel by RT-PCR (Flu A&B, Covid) Nasopharyngeal Swab     Status: None   Collection Time: 01/08/21  1:27 AM   Specimen: Nasopharyngeal Swab; Nasopharyngeal(NP) swabs in vial transport medium  Result Value Ref Range Status   SARS Coronavirus 2 by RT PCR NEGATIVE NEGATIVE Final    Comment: (NOTE) SARS-CoV-2 target nucleic acids are NOT DETECTED.  The SARS-CoV-2 RNA is generally detectable in upper respiratory specimens during the acute phase of infection. The lowest concentration of SARS-CoV-2 viral copies this assay can detect is 138 copies/mL. A negative result does not preclude SARS-Cov-2 infection and should not be used as the sole basis for treatment or other patient management decisions. A negative result may occur with  improper specimen collection/handling, submission of specimen other than nasopharyngeal swab, presence of viral mutation(s) within the areas targeted by this assay, and inadequate number of viral copies(<138 copies/mL). A negative result must be combined with clinical observations, patient history, and epidemiological information. The expected result is Negative.  Fact Sheet for Patients:  EntrepreneurPulse.com.au  Fact Sheet for Healthcare Providers:  IncredibleEmployment.be  This test is no t yet approved or cleared by the [place] FDA and  has been authorized for detection and/or diagnosis of SARS-CoV-2 by FDA under an Emergency Use Authorization (EUA). This EUA will remain  in effect (meaning this test can be used) for the duration of the COVID-19 declaration under Section 564(b)(1) of the Act, 21 U.S.C.section 360bbb-3(b)(1), unless the authorization is terminated  or revoked sooner.       Influenza A by PCR NEGATIVE NEGATIVE Final   Influenza B by PCR NEGATIVE  NEGATIVE Final    Comment: (NOTE) The Xpert Xpress SARS-CoV-2/FLU/RSV plus assay is intended as an  aid in the diagnosis of influenza from Nasopharyngeal swab specimens and should not be used as a sole basis for treatment. Nasal washings and aspirates are unacceptable for Xpert Xpress SARS-CoV-2/FLU/RSV testing.  Fact Sheet for Patients: EntrepreneurPulse.com.au  Fact Sheet for Healthcare Providers: IncredibleEmployment.be  This test is not yet approved or cleared by the [place] FDA and has been authorized for detection and/or diagnosis of SARS-CoV-2 by FDA under an Emergency Use Authorization (EUA). This EUA will remain in effect (meaning this test can be used) for the duration of the COVID-19 declaration under Section 564(b)(1) of the Act, 21 U.S.C. section 360bbb-3(b)(1), unless the authorization is terminated or revoked.  Performed at [facility], [address], [place], [place] [address]     Radiology Reports DG Chest 1 View  Result Date: 01/08/2021 CLINICAL DATA:  Peritonitis, leukocytosis EXAM: CHEST  1 VIEW COMPARISON:  07/09/2020 FINDINGS: Lungs are well expanded, symmetric, and clear. No pneumothorax or pleural effusion. Cardiac size within normal limits. Pulmonary vascularity is normal. Osseous structures are age-appropriate. No acute bone abnormality. IMPRESSION: No active disease. Electronically Signed   By: Fidela Salisbury MD   On: 01/08/2021 00:27   CT CHEST W CONTRAST  Result Date: 01/09/2021 CLINICAL DATA:  Cancer of unknown primary. EXAM: CT CHEST WITH CONTRAST TECHNIQUE: Multidetector CT imaging of the chest was performed during intravenous contrast administration. CONTRAST:  66mL OMNIPAQUE IOHEXOL 300 MG/ML  SOLN COMPARISON:  January 08, 2021. FINDINGS: Cardiovascular: Atherosclerosis of thoracic aorta is noted without aneurysm or dissection. Normal cardiac size. No pericardial effusion. Mild coronary  calcifications are noted. Mediastinum/Nodes: 4 cm right thyroid mass is noted. Mild diffuse esophageal wall thickening is noted suggesting possible esophagitis. No definite mediastinal or axillary adenopathy is noted. Lungs/Pleura: No pneumothorax or pleural effusion is noted. Minimal bibasilar subsegmental atelectasis is noted. Upper Abdomen: As noted on prior exam, ascites is noted as well as omental caking suggesting peritoneal carcinomatosis. 11 x 8 mm lymph node is seen superior to the anterior portion of the right hemidiaphragm concerning for possible metastatic disease. Musculoskeletal: No chest wall abnormality. No acute or significant osseous findings. IMPRESSION: 4 cm right thyroid mass is noted. Recommend thyroid US. (Ref: J Am Coll Radiol. 2015 Feb;12(2): 143-50). Mild coronary artery calcifications are noted. Mild diffuse esophageal wall thickening is noted suggesting possible esophagitis. Endoscopy is recommended for further evaluation. 11 x 8 mm lymph node is noted superior to the anterior portion of the right hemidiaphragm concerning for possible metastatic disease. Aortic Atherosclerosis (ICD10-I70.0). Electronically Signed   By: Marijo Conception M.D.   On: 01/09/2021 13:58   CT ABDOMEN PELVIS W CONTRAST  Result Date: 01/08/2021 CLINICAL DATA:  Abdominal pain.  Recent paracentesis EXAM: CT ABDOMEN AND PELVIS WITH CONTRAST TECHNIQUE: Multidetector CT imaging of the abdomen and pelvis was performed using the standard protocol following bolus administration of intravenous contrast. CONTRAST:  36mL OMNIPAQUE IOHEXOL 300 MG/ML  SOLN COMPARISON:  None. FINDINGS: Lower chest: No acute abnormality. Hepatobiliary: Small cyst in the inferior right hepatic lobe. No suspicious focal hepatic abnormality. Gallbladder unremarkable. Pancreas: No focal abnormality or ductal dilatation. Spleen: No focal abnormality.  Normal size. Adrenals/Urinary Tract: Left renal parapelvic cysts. No hydronephrosis. No renal or  adrenal mass. Small diverticulum off the posterior left bladder wall. No bladder wall thickening. Stomach/Bowel: Scattered colonic diverticulosis. No bowel obstruction. Stomach and small bowel decompressed, grossly unremarkable. Vascular/Lymphatic: Calcified aorta. No evidence of aneurysm or adenopathy. Reproductive: Mildly prominent prostate. Other: Moderate ascites throughout the abdomen  and pelvis. There is omental thickening/caking. Peritoneal mass seen in the left upper quadrant adjacent to the spleen measuring up to 7 cm. Other peritoneal masses adjacent to the splenic flexure of the colon measuring 3.2 cm. Musculoskeletal: No acute bony abnormality. IMPRESSION: Moderate ascites with omental thickening/caking and multiple peritoneal cystic masses. 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 Aortic atherosclerosis. Electronically Signed   By: Rolm Baptise M.D.   On: 01/08/2021 00:47   CT BIOPSY  Result Date: 01/10/2021 INDICATION: No known primary, now with concern for omental caking and malignant ascites. Please perform CT-guided biopsy of omental mass within the left upper abdominal quadrant and CT-guided paracentesis. EXAM: 1. CT-GUIDED BIOPSY OF OMENTAL MASS 2. CT-GUIDED PARACENTESIS COMPARISON:  CT abdomen and pelvis-01/08/2021 MEDICATIONS: None. ANESTHESIA/SEDATION: Fentanyl 25 mcg IV; Versed 0.5 mg IV Sedation time: 12 minutes; The patient was continuously monitored during the procedure by the interventional radiology nurse under my direct supervision. CONTRAST:  None. COMPLICATIONS: None immediate. PROCEDURE: Informed consent was obtained from the patient following an explanation of the procedure, risks, benefits and alternatives. A time out was performed prior to the initiation of the procedure. The patient was positioned supine on the CT table and a limited CT was performed for procedural planning demonstrating unchanged size and appearance of the approximately 3.6 x 3.4 cm omental mass  within the left upper abdominal quadrant (image 45) as well as recurrent at least moderate volume intra-abdominal ascites. The procedures were planned. The operative sites were prepped and draped in the usual sterile fashion. A spot along the inferolateral aspect of the left abdomen was anesthetized with 1% lidocaine with epinephrine allowing for placement of a Safe-T-Centesis catheter. Appropriate position was confirmed with CT imaging and the paracentesis was initiated. Next, the omental mass with the left upper abdominal quadrant was targeted with a 17 gauge coaxial needle after the adjacent subcutaneous tissues were anesthetized 1% lidocaine with epinephrine. Appropriate position was confirmed with CT imaging. Next, 8 core needle biopsy samples were obtained with an 18 gauge core needle biopsy device. The co-axial needle was removed and hemostasis was achieved with manual compression. The paracentesis was completed and the CT centesis catheter was removed intact. A limited postprocedural CT was negative for hemorrhage or additional complication. A dressing was applied. The patient tolerated the procedure well without immediate postprocedural complication. IMPRESSION: 1. Technically successful CT guided core needle biopsy of omental mass within the left upper abdominal quadrant. 2. Technically successful CT guided paracentesis yielding 4.4 L of serous fluid. Electronically Signed   By: Sandi Mariscal M.D.   On: 01/10/2021 15:40   CT Paracentesis  Result Date: 01/10/2021 INDICATION: No known primary, now with concern for omental caking and malignant ascites. Please perform CT-guided biopsy of omental mass within the left upper abdominal quadrant and CT-guided paracentesis. EXAM: 1. CT-GUIDED BIOPSY OF OMENTAL MASS 2. CT-GUIDED PARACENTESIS COMPARISON:  CT abdomen and pelvis-01/08/2021 MEDICATIONS: None. ANESTHESIA/SEDATION: Fentanyl 25 mcg IV; Versed 0.5 mg IV Sedation time: 12 minutes; The patient was  continuously monitored during the procedure by the interventional radiology nurse under my direct supervision. CONTRAST:  None. COMPLICATIONS: None immediate. PROCEDURE: Informed consent was obtained from the patient following an explanation of the procedure, risks, benefits and alternatives. A time out was performed prior to the initiation of the procedure. The patient was positioned supine on the CT table and a limited CT was performed for procedural planning demonstrating unchanged size and appearance of the approximately 3.6 x 3.4 cm omental mass within the  left upper abdominal quadrant (image 45) as well as recurrent at least moderate volume intra-abdominal ascites. The procedures were planned. The operative sites were prepped and draped in the usual sterile fashion. A spot along the inferolateral aspect of the left abdomen was anesthetized with 1% lidocaine with epinephrine allowing for placement of a Safe-T-Centesis catheter. Appropriate position was confirmed with CT imaging and the paracentesis was initiated. Next, the omental mass with the left upper abdominal quadrant was targeted with a 17 gauge coaxial needle after the adjacent subcutaneous tissues were anesthetized 1% lidocaine with epinephrine. Appropriate position was confirmed with CT imaging. Next, 8 core needle biopsy samples were obtained with an 18 gauge core needle biopsy device. The co-axial needle was removed and hemostasis was achieved with manual compression. The paracentesis was completed and the CT centesis catheter was removed intact. A limited postprocedural CT was negative for hemorrhage or additional complication. A dressing was applied. The patient tolerated the procedure well without immediate postprocedural complication. IMPRESSION: 1. Technically successful CT guided core needle biopsy of omental mass within the left upper abdominal quadrant. 2. Technically successful CT guided paracentesis yielding 4.4 L of serous fluid.  Electronically Signed   By: Sandi Mariscal M.D.   On: 01/10/2021 15:40   US THYROID  Result Date: 01/10/2021 CLINICAL DATA:  Incidental on CT. EXAM: THYROID ULTRASOUND TECHNIQUE: Ultrasound examination of the thyroid gland and adjacent soft tissues was performed. COMPARISON:  None. FINDINGS: Parenchymal Echotexture: Mildly heterogenous Isthmus: 0.4 cm Right lobe: 4.5 x 3.1 x 3.0 cm Left lobe: 2.9 x 1.5 x 1.0 cm _________________________________________________________ Estimated total number of nodules >/= 1 cm: 1 Number of spongiform nodules >/=  2 cm not described below (TR1): 0 Number of mixed cystic and solid nodules >/= 1.5 cm not described below (TR2): 0 _________________________________________________________ Nodule # 1: Location: Right; mid Maximum size: 3.7 cm; Other 2 dimensions: 2.9 x 3.5 cm Composition: solid/almost completely solid (2) Echogenicity: isoechoic (1) Shape: taller-than-wide (3) Margins: ill-defined (0) Echogenic foci: none (0) ACR TI-RADS total points: 6. ACR TI-RADS risk category: TR4 (4-6 points). ACR TI-RADS recommendations: **Given size (>/= 1.5 cm) and appearance, fine needle aspiration of this moderately suspicious nodule should be considered based on TI-RADS criteria. _________________________________________________________ Subcentimeter left thyroid nodule does not meet criteria for FNA or surveillance. IMPRESSION: Nodule 1 (TI-RADS 4) located in the mid right thyroid lobe should be further evaluated with FNA. The above is in keeping with the ACR TI-RADS recommendations - J Am Coll Radiol 2017;14:587-595. Electronically Signed   By: Miachel Roux M.D.   On: 01/10/2021 11:41   IR Paracentesis  Result Date: 01/06/2021 INDICATION: Abdominal distention with ascites. Request for therapeutic and diagnostic paracentesis. EXAM: ULTRASOUND GUIDED RIGHT LOWER QUADRANT PARACENTESIS MEDICATIONS: % plain lidocaine, 5 mL COMPLICATIONS: None immediate. PROCEDURE: Informed written consent was  obtained from the patient after a discussion of the risks, benefits and alternatives to treatment. A timeout was performed prior to the initiation of the procedure. Initial ultrasound scanning demonstrates a large amount of ascites within the right lower abdominal quadrant. The right lower abdomen was prepped and draped in the usual sterile fashion. 1% lidocaine was used for local anesthesia. Following this, a 6 Fr Safe-T-Centesis catheter was introduced. An ultrasound image was saved for documentation purposes. The paracentesis was performed. The catheter was removed and a dressing was applied. The patient tolerated the procedure well without immediate post procedural complication. FINDINGS: A total of approximately 4.2 L of hazy, serosanguineous fluid was removed. Samples  were sent to the laboratory as requested by the clinical team. IMPRESSION: Successful ultrasound-guided paracentesis yielding 4.2 liters of peritoneal fluid. Read by: Ascencion Dike PA-C Electronically Signed   By: Ruthann Cancer MD   On: 01/06/2021 12:39    Lab Data:  CBC: Recent Labs  Lab 01/04/2021 2047 01/09/21 0540 01/10/21 0513 01/11/21 0640  WBC 11.7* 12.6* 14.3* 16.6*  NEUTROABS 9.5*  --   --   --   HGB 17.4* 17.1* 17.1* 16.8  HCT 51.5 52.4* 52.5* 51.1  MCV 90.2 93.4 94.3 92.7  PLT 408* 448* 411* 488   Basic Metabolic Panel: Recent Labs  Lab 12/25/2020 2047 01/09/21 0540 01/10/21 0513 01/11/21 0640  NA 127* 129* 128* 128*  K 5.3* 4.8 5.0 4.5  CL 95* 99 100 99  CO2 18* 18* 14* 18*  GLUCOSE 142* 130* 129* 123*  BUN 73* 83* 90* 84*  CREATININE 1.46* 1.67* 1.63* 1.56*  CALCIUM 9.0 8.4* 8.3* 8.4*   GFR: Estimated Creatinine Clearance: 26 mL/min (A) (by C-G formula based on SCr of 1.56 mg/dL (H)). Liver Function Tests: Recent Labs  Lab 12/20/2020 2047 01/10/21 0513  AST 32 27  ALT 25 29  ALKPHOS 92 90  BILITOT 0.5 0.7  PROT 6.0* 5.8*  ALBUMIN 3.2* 2.7*   No results for input(s): LIPASE, AMYLASE in the  last 168 hours. No results for input(s): AMMONIA in the last 168 hours. Coagulation Profile: No results for input(s): INR, PROTIME in the last 168 hours. Cardiac Enzymes: No results for input(s): CKTOTAL, CKMB, CKMBINDEX, TROPONINI in the last 168 hours. BNP (last 3 results) No results for input(s): PROBNP in the last 8760 hours. HbA1C: No results for input(s): HGBA1C in the last 72 hours. CBG: No results for input(s): GLUCAP in the last 168 hours. Lipid Profile: No results for input(s): CHOL, HDL, LDLCALC, TRIG, CHOLHDL, LDLDIRECT in the last 72 hours. Thyroid Function Tests: Recent Labs    01/10/21 0936 01/11/21 0640  TSH  --  2.849  FREET4 0.81  --    Anemia Panel: No results for input(s): VITAMINB12, FOLATE, FERRITIN, TIBC, IRON, RETICCTPCT in the last 72 hours. Urine analysis:    Component Value Date/Time   COLORURINE YELLOW 01/08/2021 0625   APPEARANCEUR CLEAR 01/08/2021 0625   LABSPEC >1.046 (H) 01/08/2021 0625   PHURINE 6.0 01/08/2021 0625   GLUCOSEU NEGATIVE 01/08/2021 0625   HGBUR NEGATIVE 01/08/2021 0625   BILIRUBINUR NEGATIVE 01/08/2021 0625   KETONESUR 15 (A) 01/08/2021 0625   PROTEINUR 30 (A) 01/08/2021 0625   NITRITE NEGATIVE 01/08/2021 0625   LEUKOCYTESUR NEGATIVE 01/08/2021 0625     Ellana Kawa M.D. [facility] 01/11/2021, 1:04 PM  Available via Epic secure chat 7am-7pm After 7 pm, please refer to night coverage provider listed on amion.

## 2021-01-11 NOTE — Plan of Care (Signed)
  Problem: Clinical Measurements: Goal: Ability to maintain clinical measurements within normal limits will improve Outcome: Progressing Goal: Will remain free from infection Outcome: Progressing Goal: Respiratory complications will improve Outcome: Progressing Goal: Cardiovascular complication will be avoided Outcome: Progressing   

## 2021-01-11 NOTE — Evaluation (Signed)
Clinical/Bedside Swallow Evaluation Patient Details  Name: Stephen Turner MRN: [id number] Date of Birth: [birth year]  Today's Date: 01/11/2021 Time: SLP Start Time (ACUTE ONLY): 52 SLP Stop Time (ACUTE ONLY): 1434 SLP Time Calculation (min) (ACUTE ONLY): 18 min  Past Medical History:  Past Medical History:  Diagnosis Date   Arthritis    HLD (hyperlipidemia)    HTN (hypertension)    Past Surgical History:  Past Surgical History:  Procedure Laterality Date   APPENDECTOMY     CARPAL TUNNEL RELEASE Bilateral    CATARACT EXTRACTION, BILATERAL     IR PARACENTESIS  01/06/2021   TONSILLECTOMY     HPI:  87 y.o. male with medical history significant for hypertension, dyslipidemia and arthritis who presents to the emergency room for evaluation of increased abdominal distention, low-grade fever, poor oral intake and not feeling well.  Patient had recent paracentesis done 2 days prior to his admission where he had drained 4.2 L of fluid.  He does not have a known history of liver cirrhosis or history of CHF and no history of ascites in the past.  Patient was contacted and advised to go to the emergency room due to concerns for possible SBP because his ascitic fluid was said to be turbid in appearance and had 88% neutrophils.  He complains of diffuse abdominal pain associated with poor oral intake and decreased urine output.  He also noted a decrease in his movement as well.  He has had chills at home as well as a low-grade fever with a T-max of 100F.01/08/21 CXR negative for acute disease, but thyroid mass noted on CT chest 01/10/21 with subsequent biopsy/paracentesis.  BSE generated to assess swallow function.  Assessment / Plan / Recommendation Clinical Impression  Pt seen for clinical swallowing assessment with various consistencies noted with impaired mastication noted with solids d/t generalized weakness/decreased satiety.  Pt consumed ice chips, thin via straw (independently using small sips) and  puree without overt s/s of aspiration.  Speech was mildly dysarthric during conversation, but pt had just woken up from a nap when SLP arrived.  This did not appear to affect overall oral control/propulsion.  Pt's vocal quality was judged to be low vocal intensity, but PO intake appeared grossly within normal limits for assessment.  Pt with deconditioning/generalized weakness overall, so ST will f/u for diet tolerance with a meal x1 to r/o aspiration or need for swallowing precautions/strategies beyond general swallowing guidelines.  Esophageal precautions/dysphagia may be considered with hx of GERD, regurgitation and belching during meals.  Recommend Dysphagia 3 (with minced meat)/thin liquids per pt request.  Thank you for this consult.  SLP Visit Diagnosis: Dysphagia, unspecified (R13.10)    Aspiration Risk  Mild aspiration risk    Diet Recommendation   Dysphagia 3 (with minced meats)/thin liquids  Medication Administration: Whole meds with puree (or with liquids per pt preference if able)    Other  Recommendations Oral Care Recommendations: Oral care BID   Follow up Recommendations Other (comment) (TBD)      Frequency and Duration min 1 x/week  1 week       Prognosis Prognosis for Safe Diet Advancement: Good      Swallow Study   General Date of Onset: 01/03/2021 HPI: 87 y.o. male with medical history significant for hypertension, dyslipidemia and arthritis who presents to the emergency room for evaluation of increased abdominal distention, low-grade fever, poor oral intake and not feeling well.  Patient had recent paracentesis done 2 days prior to his  admission where he had drained 4.2 L of fluid.  He does not have a known history of liver cirrhosis or history of CHF and no history of ascites in the past.  Patient was contacted and advised to go to the emergency room due to concerns for possible SBP because his ascitic fluid was said to be turbid in appearance and had 88% neutrophils.   He complains of diffuse abdominal pain associated with poor oral intake and decreased urine output.  He also noted a decrease in his movement as well.  He has had chills at home as well as a low-grade fever with a T-max of 100F. Type of Study: Bedside Swallow Evaluation Previous Swallow Assessment: n/a Diet Prior to this Study: Dysphagia 2 (chopped);Thin liquids Temperature Spikes Noted: No Respiratory Status: Room air History of Recent Intubation: No Behavior/Cognition: Alert;Cooperative;Requires cueing Oral Cavity Assessment: Within Functional Limits Oral Care Completed by SLP: Recent completion by staff Oral Cavity - Dentition: Adequate natural dentition Vision: Functional for self-feeding Self-Feeding Abilities: Able to feed self;Needs assist Patient Positioning: Upright in bed Baseline Vocal Quality: Low vocal intensity Volitional Cough: Strong Volitional Swallow: Able to elicit    Oral/Motor/Sensory Function Overall Oral Motor/Sensory Function: Generalized oral weakness   Ice Chips Ice chips: Within functional limits Presentation: Spoon   Thin Liquid Thin Liquid: Within functional limits Presentation: Straw;Self Fed (utilized small sips)    Nectar Thick Nectar Thick Liquid: Not tested   Honey Thick Honey Thick Liquid: Not tested   Puree Puree: Within functional limits Presentation: Self Fed   Solid     Solid: Impaired Presentation: Self Fed Oral Phase Impairments: Impaired mastication Oral Phase Functional Implications: Impaired mastication      Elvina Sidle, M.S., CCC-SLP 01/11/2021,4:39 PM

## 2021-01-11 NOTE — Evaluation (Signed)
Physical Therapy Evaluation Patient Details Name: Stephen Turner MRN: [id number] DOB: 09-Mar-1923 Today's Date: 01/11/2021   History of Present Illness  Patient is a 87 year old male with hypertension, hyperlipidemia, arthritis presented to ED for increasing abdominal distention, low-grade fevers, poor oral intake and not feeling well. Pt with a new diagnosis of Peritoneal carcinomatosis with unknown primary.  Clinical Impression  Pt admitted with above diagnosis.  Pt currently with functional limitations due to the deficits listed below (see PT Problem List). Pt will benefit from skilled PT to increase their independence and safety with mobility to allow discharge to the venue listed below.  Pt was independent until 10 days ago when he started feeling ill.  Daughter reports they have met with palliative and will make decisions based on hospice vs continued care in the next few days.  PT will follow acutely and continue to assess therapy and DME needs.     Follow Up Recommendations Other (comment);Home health PT;SNF (awaiting further medical results which will assist family in deciding further plan of care)    Equipment Recommendations  Other (comment) (continue to assess)    Recommendations for Other Services       Precautions / Restrictions Precautions Precautions: Fall      Mobility  Bed Mobility Overal bed mobility: Needs Assistance Bed Mobility: Rolling;Sidelying to Sit Rolling: Min assist Sidelying to sit: Min assist       General bed mobility comments: cues for technique and to assist with trunk.  Required increase time, but gives forth good effort.    Transfers Overall transfer level: Needs assistance Equipment used: Rolling walker (2 wheeled) Transfers: Sit to/from [facility] Sit to Stand: Min assist;From elevated surface Stand pivot transfers: Min assist       General transfer comment: cues for hand placement and to not pull up on RW.  A to power  up. Small shuffeling steps with SPT to chair. HR 88 o2 97%, but very fatigued with transfer  Ambulation/Gait             General Gait Details: SPT to recliner with flexe posture and small shuffeled steps  Stairs            Wheelchair Mobility    Modified Rankin (Stroke Patients Only)       Balance Overall balance assessment: Needs assistance Sitting-balance support: Feet supported Sitting balance-Leahy Scale: Fair Sitting balance - Comments: some slight posterior lean initially that improved with sitting   Standing balance support: Bilateral upper extremity supported Standing balance-Leahy Scale: Poor Standing balance comment: requires UE support                             Pertinent Vitals/Pain Pain Assessment: Faces Faces Pain Scale: Hurts a little bit Pain Location: arthritis Pain Descriptors / Indicators: Sore Pain Intervention(s): Limited activity within patient's tolerance    Home Living Family/patient expects to be discharged to:: Private residence Living Arrangements: Spouse/significant other Available Help at Discharge: Family;Available PRN/intermittently (caregiver for wife with some assistance) Type of Home: House       Home Layout: Two level (bedroom downstairs, but his bathroom and his "space" is upstairs) Home Equipment: Walker - 2 wheels;Shower seat;Toilet riser;Transport chair      Prior Function Level of Independence: Independent;Independent with assistive device(s)         Comments: amb without assistance until 10 days ago when he started using the RW     Hand Dominance  Extremity/Trunk Assessment   Upper Extremity Assessment Upper Extremity Assessment: Generalized weakness    Lower Extremity Assessment Lower Extremity Assessment: Generalized weakness    Cervical / Trunk Assessment Cervical / Trunk Assessment: Kyphotic  Communication   Communication: Other (comment) (slow to respond due to sore  throat)  Cognition Arousal/Alertness: Awake/alert Behavior During Therapy: WFL for tasks assessed/performed Overall Cognitive Status: Within Functional Limits for tasks assessed                                        General Comments      Exercises     Assessment/Plan    PT Assessment Patient needs continued PT services  PT Problem List Decreased strength;Decreased activity tolerance;Decreased balance;Decreased mobility       PT Treatment Interventions DME instruction;Gait training;Functional mobility training;Therapeutic exercise;Therapeutic activities;Balance training;Patient/family education    PT Goals (Current goals can be found in the Care Plan section)  Acute Rehab PT Goals Patient Stated Goal: Get out of the bed PT Goal Formulation: With patient/family Time For Goal Achievement: 01/25/21 Potential to Achieve Goals: Fair    Frequency Min 3X/week   Barriers to discharge        Co-evaluation               AM-PAC PT "6 Clicks" Mobility  Outcome Measure Help needed turning from your back to your side while in a flat bed without using bedrails?: A Little Help needed moving from lying on your back to sitting on the side of a flat bed without using bedrails?: A Little Help needed moving to and from a bed to a chair (including a wheelchair)?: A Little Help needed standing up from a chair using your arms (e.g., wheelchair or bedside chair)?: A Little Help needed to walk in hospital room?: A Little Help needed climbing 3-5 steps with a railing? : A Lot 6 Click Score: 17    End of Session Equipment Utilized During Treatment: Gait belt Activity Tolerance: Patient limited by fatigue Patient left: in chair;with call bell/phone within reach;with chair alarm set;with family/visitor present Nurse Communication: Mobility status (nurse tech) PT Visit Diagnosis: Unsteadiness on feet (R26.81);Other abnormalities of gait and mobility (R26.89);Muscle weakness  (generalized) (M62.81)    Time: 1026-1100 PT Time Calculation (min) (ACUTE ONLY): 34 min   Charges:   PT Evaluation $PT Eval Moderate Complexity: 1 Mod PT Treatments $Therapeutic Activity: 8-22 mins        Stephen Turner L. Tamala Julian, [place]  01/11/2021   Galen Manila 01/11/2021, 11:46 AM

## 2021-01-12 ENCOUNTER — Inpatient Hospital Stay (HOSPITAL_COMMUNITY): Payer: Medicare Other

## 2021-01-12 DIAGNOSIS — Z7189 Other specified counseling: Secondary | ICD-10-CM | POA: Diagnosis not present

## 2021-01-12 DIAGNOSIS — K7011 Alcoholic hepatitis with ascites: Secondary | ICD-10-CM

## 2021-01-12 DIAGNOSIS — Z515 Encounter for palliative care: Secondary | ICD-10-CM | POA: Diagnosis not present

## 2021-01-12 DIAGNOSIS — R180 Malignant ascites: Secondary | ICD-10-CM | POA: Diagnosis not present

## 2021-01-12 DIAGNOSIS — R1010 Upper abdominal pain, unspecified: Secondary | ICD-10-CM | POA: Diagnosis not present

## 2021-01-12 DIAGNOSIS — C786 Secondary malignant neoplasm of retroperitoneum and peritoneum: Secondary | ICD-10-CM | POA: Diagnosis not present

## 2021-01-12 DIAGNOSIS — N179 Acute kidney failure, unspecified: Secondary | ICD-10-CM | POA: Diagnosis not present

## 2021-01-12 LAB — BASIC METABOLIC PANEL
Anion gap: 13 (ref 5–15)
BUN: 88 mg/dL — ABNORMAL HIGH (ref 8–23)
CO2: 16 mmol/L — ABNORMAL LOW (ref 22–32)
Calcium: 8.9 mg/dL (ref 8.9–10.3)
Chloride: 101 mmol/L (ref 98–111)
Creatinine, Ser: 1.69 mg/dL — ABNORMAL HIGH (ref 0.61–1.24)
GFR, Estimated: 37 mL/min — ABNORMAL LOW (ref >60)
Glucose, Bld: 113 mg/dL — ABNORMAL HIGH (ref 70–99)
Potassium: 5.7 mmol/L — ABNORMAL HIGH (ref 3.5–5.1)
Sodium: 130 mmol/L — ABNORMAL LOW (ref 135–145)

## 2021-01-12 LAB — POTASSIUM: Potassium: 5.5 mmol/L — ABNORMAL HIGH (ref 3.5–5.1)

## 2021-01-12 MED ORDER — MELATONIN 5 MG PO TABS
5.0000 mg | ORAL_TABLET | Freq: Every day | ORAL | Status: DC
  Administered 2021-01-12: 5 mg via ORAL
  Filled 2021-01-12: qty 1

## 2021-01-12 MED ORDER — MORPHINE SULFATE (PF) 4 MG/ML IV SOLN
4.0000 mg | INTRAVENOUS | Status: DC | PRN
  Administered 2021-01-13 (×2): 4 mg via INTRAVENOUS
  Filled 2021-01-12 (×2): qty 1

## 2021-01-12 MED ORDER — MIRTAZAPINE 15 MG PO TABS
15.0000 mg | ORAL_TABLET | Freq: Every day | ORAL | Status: DC
  Administered 2021-01-12: 15 mg via ORAL
  Filled 2021-01-12: qty 1

## 2021-01-12 MED ORDER — SODIUM ZIRCONIUM CYCLOSILICATE 10 G PO PACK
10.0000 g | PACK | Freq: Once | ORAL | Status: AC
  Administered 2021-01-12: 10 g via ORAL
  Filled 2021-01-12: qty 1

## 2021-01-12 NOTE — Progress Notes (Signed)
Mr Stephen Turner agreed to be repositioned to his Right side. He refused taking his Pravachol, he is too sleepy to try and swallow pills.

## 2021-01-12 NOTE — Progress Notes (Signed)
Daily Progress Note   Patient Name: Stephen Turner       Date: 01/12/2021 DOB: 09-12-1922  Age: 87 y.o. MRN#: [id number] Attending Physician: Mendel Corning, MD Primary Care Physician: Leanna Battles, MD Admit Date: 12/16/2020  Reason for Consultation/Follow-up: Establishing goals of care  Subjective: I met today with Stephen Turner, his son, and his daughter.  Stephen Turner is awake and alert but appears very weak and frail.  I began discussion about his situation and options for his care moving forward.  After asking him a couple of open-ended questions he stopped me and told me that he has made a decision to transition over to hospice care.  He states that he feels the only real decision is whether he is going to try to transition home or see if he is eligible for [facility] at time of discharge.  His primary concern is being able to see his wife and his daughter noted that his wife could come visit him and [facility] but he is not sure if this will actually be a realistic option.  We broadly discussed hospice and care options including home versus residential setting.  Discussed that the focus of care would be on comfort as he approaches end-of-life and interventions such as IV fluids and antibiotics would not be continued at residential hospice.  Family inquired about potential for Pleurx catheter.  Informed him that and spoke with one of the IR providers who confirmed to me that he would not be able to get Pleurx catheter if he has SBP.  Family inquired why this would not be an option if only goal moving forward is for his comfort.  We discussed his continued decline in nutrition, his overall weakness that is worsening, and his elevated white count.  Family reports they would find it helpful  to have the opportunity to sit down with liaison from hospice discuss further.  I told him I will call and see if this can be arranged for sometime tomorrow.  Length of Stay: 4  Current Medications: Scheduled Meds:   acetaminophen  1,000 mg Oral Once   amLODipine  5 mg Oral Daily   aspirin EC  81 mg Oral Daily   enoxaparin (LOVENOX) injection  30 mg Subcutaneous Q24H   melatonin  5 mg Oral QHS  mirtazapine  15 mg Oral QHS   pantoprazole  40 mg Oral Daily   pravastatin  40 mg Oral q1800    Continuous Infusions:  cefTRIAXone (ROCEPHIN)  IV 2 g (01/12/21 0956)    PRN Meds: alum & mag hydroxide-simeth, nitroGLYCERIN, ondansetron **OR** ondansetron (ZOFRAN) IV, traMADol  Physical Exam         General: Sleepy but awake, in no acute distress.   HEENT: No bruits, no goiter, no JVD Heart: Regular rate and rhythm. No murmur appreciated. Lungs: Fair air movement, clear Abdomen: Globally tender, distended, positive bowel sounds.   Skin: Warm and dry Neuro: Grossly intact, nonfocal.   Vital Signs: BP 106/63 (BP Location: Right Arm)   Pulse 88   Temp 98.6 F (37 C) (Oral)   Resp 15   Ht $R'5\' 4"'QW$  (1.626 m)   Wt 73.7 kg   SpO2 92%   BMI 27.89 kg/m  SpO2: SpO2: 92 % O2 Device: O2 Device: Room Air O2 Flow Rate: O2 Flow Rate (L/min): 2 L/min  Intake/output summary:  Intake/Output Summary (Last 24 hours) at 01/12/2021 1541 Last data filed at 01/12/2021 1258 Gross per 24 hour  Intake 1234.59 ml  Output 200 ml  Net 1034.59 ml    LBM: Last BM Date: 01/11/21 Baseline Weight: Weight: 73.7 kg Most recent weight: Weight: 73.7 kg       Palliative Assessment/Data:    Flowsheet Rows    Flowsheet Row Most Recent Value  Intake Tab   Referral Department Hospitalist  Unit at Time of Referral Med/Surg Unit  Palliative Care Primary Diagnosis Cancer  Date Notified 01/09/21  Palliative Care Type New Palliative care  Reason for referral Clarify Goals of Care  Date of Admission 01/05/2021   Date first seen by Palliative Care 01/10/21  # of days Palliative referral response time 1 Day(s)  # of days IP prior to Palliative referral 2  Clinical Assessment   Palliative Performance Scale Score 30%  Psychosocial & Spiritual Assessment   Palliative Care Outcomes   Patient/Family meeting held? Yes  Who was at the meeting? Patient       Patient Active Problem List   Diagnosis Date Noted   Abdominal pain 01/08/2021   SBP (spontaneous bacterial peritonitis) ([place]) 01/08/2021   Peritoneal carcinomatosis ([place]) 01/08/2021   Dehydration with hyponatremia 01/08/2021   Hyperkalemia 01/08/2021   Physical deconditioning 01/08/2021   Precordial pain    Angina pectoris ([place])    Hyperlipidemia with target low density lipoprotein (LDL) cholesterol less than 70 mg/dL    Hoarseness 01/04/2020   Throat clearing 01/04/2020   Gastroesophageal reflux disease 01/04/2020   Essential hypertension 11/01/2007   PERSONAL HISTORY OF COLONIC POLYPS 11/01/2007    Palliative Care Assessment & Plan   Patient Profile: 87 year old male with past medical history hypertension, hyperlipidemia, arthritis who was admitted with poor intake and abdominal distention with concern for SBP and found to have imaging consistent with carcinomatosis involving the mesentery.  He is status postbiopsy and results are pending.  Palliative consulted for goals of care.  Recommendations/Plan: Discussed again with Stephen Turner and his children that he has advanced cancer that is not going to be cured and is poor candidate for systemic therapy.  Biopsy pending.  Prior, he had wanted to know results of biopsy before making any decisions.  Now he reports he is clear in his decision to transition to hospice care at time of discharge from the hospital.   Family requested again to have Pleurx catheter  placed.  I discussed with physician from IR who let me know that Pleurx would not be placed with SBP.  Family requested this be  reevaluated if only goal is for him to be comfortable. Will reach out to hospice to see if will be possible for liaison to meet with Stephen Turner and his family to discuss hospice options including home hospice versus residential facility.  Code Status:    Code Status Orders  (From admission, onward)           Start     Ordered   01/08/21 1423  Do not attempt resuscitation (DNR)  Continuous       Question Answer Comment  In the event of cardiac or respiratory ARREST Do not call a "code blue"   In the event of cardiac or respiratory ARREST Do not perform Intubation, CPR, defibrillation or ACLS   In the event of cardiac or respiratory ARREST Use medication by any route, position, wound care, and other measures to relive pain and suffering. May use oxygen, suction and manual treatment of airway obstruction as needed for comfort.   Comments Patient requests DNR status      01/08/21 1424           Code Status History     This patient has a current code status but no historical code status.       Prognosis: It appears that Stephen Turner has had significant decline and increasing weakness over the past couple of days.  His white blood count is climbing.  He is taking in very little in terms of nutrition.  While I had previously thought that he likely had weeks to months, I am concerned now that he may be decompensating more quickly.  Discharge Planning: To Be Determined  Care plan was discussed with patient, son, daughter  Thank you for allowing the Palliative Medicine Team to assist in the care of this patient.   Time In: 1300 Time Out: 1350 Total Time 50 Prolonged Time Billed No   Greater than 50%  of this time was spent counseling and coordinating care related to the above assessment and plan.   Micheline Rough, MD  Please contact Palliative Medicine Team phone at [phone] for questions and concerns.

## 2021-01-12 NOTE — Progress Notes (Signed)
[facility]                                                                              Patient Demographics  Stephen Turner, is a 87 y.o. male, DOB - 1922/10/02, AOZ:[id number]  Admit date - 12/22/2020   Admitting Physician Vernelle Emerald, MD  Outpatient Primary MD for the patient is Leanna Battles, MD  Outpatient specialists:   LOS - 4  days   Medical records reviewed and are as summarized below:    Chief Complaint  Patient presents with   Abdominal Pain       Brief summary   Patient is a 87 year old male with hypertension, hyperlipidemia, arthritis presented to ED for increasing abdominal distention, low-grade fevers, poor oral intake and not feeling well.  Patient had a recent paracentesis done on 01/06/2021 prior to the admission and had drained 4.2 L of fluid.  He was contacted and advised to go to the ER due to concerns of possible SBP.  Patient complained of diffuse abdominal pain associated with poor oral intake and decreased urine output.  Patient had low-grade fevers at home, temp of 100 F. CT abdomen pelvis showed moderate ascites with omental thickening/caking and multiple peritoneal cystic masses concerning for peritoneal carcinomatosis Patient was admitted for further work-up  Assessment & Plan    Principal Problem: New onset ascites with concern for SBP -Patient presented with new onset ascites, status post recent paracentesis on 6/27, low-grade fevers, poor oral intake -Status post paracentesis, was noted to have 80% neutrophils with ascitic fluid turbid, concerning for SBP -Underwent paracentesis x2 on 7/1, drained 4.4 L, 88% neutrophils, follow cytology -At this point option, would be palliative paracentesis for comfort or if peritoneal catheter drain possible (unclear if that it is even feasible due to peritoneal carcinomatosis).  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 -Tumor markers showed CA 19-9 71, PSA 5.95, CEA normal -CT chest showed right thyroid mass, possible esophagitis.  11x 8 mm lymph node in anterior portion of the right hemidiaphragm concerning for possible metastatic disease -Underwent CT-guided biopsy per IR on 7/1.  Biopsy results pending  -Right thyroid lobe with 3.7 cm nodule, recommended FNAC given size> 1.5 cm.  Thyroid function fairly stable.  Discussed with the patient and daughter, no further invasive procedures. -Palliative GOC in process for hospice care.    Dehydration with hyponatremia, poor oral intake, lactic acidosis -Sodium stable 128    Hyperkalemia -Potassium stable  Acute kidney injury -Likely due to peritoneal carcinomatosis, dehydration.  -Creatinine 1.6    Physical deconditioning -Likely due to malignancy  Insomnia with poor appetite -Continue Remeron  Code Status: dnr DVT Prophylaxis:  enoxaparin (LOVENOX) injection 30 mg Start: 01/09/21 2200   Level of Care: Level of care: Telemetry Family Communication: Discussed all imaging results, lab results, explained to the patient and daughter at the bedside   Disposition Plan:     Status is: Inpatient  Remains inpatient appropriate because:Inpatient level of care appropriate due to severity of illness  Dispo: The patient is from: Home  Anticipated d/c is to: Home              Patient currently is not medically stable to d/c.  Unclear   Difficult to place patient No   Time Spent in minutes 15 minutes  Procedures:  CT-guided biopsy of indeterminate omental mass in the left upper abdominal quadrant Paracentesis 7/1  Consultants:   Oncology  Interventional etiology Palliative medicine  Antimicrobials:   Anti-infectives (From admission, onward)    Start     Dose/Rate Route Frequency Ordered Stop   01/08/21 1600  cefTRIAXone (ROCEPHIN) 2 g in sodium chloride 0.9 % 100 mL IVPB        2 g 200  mL/hr over 30 Minutes Intravenous Daily 01/08/21 1424     01/08/21 0115  cefTRIAXone (ROCEPHIN) 1 g in sodium chloride 0.9 % 100 mL IVPB  Status:  Discontinued        1 g 200 mL/hr over 30 Minutes Intravenous Every 24 hours 01/08/21 0102 01/08/21 1424          Medications  Scheduled Meds:  acetaminophen  1,000 mg Oral Once   amLODipine  5 mg Oral Daily   aspirin EC  81 mg Oral Daily   enoxaparin (LOVENOX) injection  30 mg Subcutaneous Q24H   melatonin  5 mg Oral QHS   mirtazapine  15 mg Oral QHS   pantoprazole  40 mg Oral Daily   pravastatin  40 mg Oral q1800   Continuous Infusions:  cefTRIAXone (ROCEPHIN)  IV 2 g (01/12/21 0956)   PRN Meds:.alum & mag hydroxide-simeth, nitroGLYCERIN, ondansetron **OR** ondansetron (ZOFRAN) IV, traMADol      Subjective:   Stephen Turner was seen and examined today.  States did not sleep well last night.  P.o. intake improving.  Generalized weakness.  Daughter at bedside.   Objective:   Vitals:   01/11/21 1551 01/11/21 2019 01/12/21 0402 01/12/21 0938  BP: 105/72 100/68 105/68 104/69  Pulse: 84 85 86 85  Resp: 17 14 18    Temp: 97.6 F (36.4 C) 97.7 F (36.5 C) 97.7 F (36.5 C)   TempSrc: Oral Oral Oral   SpO2: 92% 92% 90% 91%  Weight:      Height:        Intake/Output Summary (Last 24 hours) at 01/12/2021 1321 Last data filed at 01/12/2021 1258 Gross per 24 hour  Intake 2244.54 ml  Output 200 ml  Net 2044.54 ml      Wt Readings from Last 3 Encounters:  01/09/21 73.7 kg  07/23/20 75.4 kg  07/09/20 73.7 kg   Physical Exam General: Alert and oriented x 3, ill-appearing Cardiovascular: S1 S2 clear, RRR. No pedal edema b/l Respiratory: CTAB Gastrointestinal: Soft, nontender, distended, NBS Ext: no pedal edema bilaterally Neuro: no new deficits Psych: Normal affect and demeanor, alert and oriented x3     Data Reviewed:  I have personally reviewed following labs and imaging studies  Micro Results Recent Results  (from the past 240 hour(s))  Acid Fast Smear (AFB)     Status: None   Collection Time: 01/06/21 12:26 PM   Specimen: Abdomen; Peritoneal Fluid  Result Value Ref Range Status   AFB Specimen Processing Concentration  Final   Acid Fast Smear Negative  Final    Comment: (NOTE) Performed At: [facility] [address] [place], [place] [id number] Rush Farmer MD DG:[id number]    Source (AFB) ABDOMEN  Final    Comment: Performed at [facility], [place]  [address] [place], [place] [address]  Blood culture (routine x 2)     Status: None (Preliminary result)   Collection Time: 01/08/21 12:30 AM   Specimen: BLOOD  Result Value Ref Range Status   Specimen Description   Final    BLOOD BOTTLES DRAWN AEROBIC AND ANAEROBIC Performed at [facility], [address], [place], [place] [address]    Special Requests   Final    BLOOD RIGHT HAND Blood Culture adequate volume Performed at [facility], [address], [place], [place] [address]    Culture   Final    NO GROWTH 4 DAYS Performed at [facility], [place] [address] [place], [place] [address]    Report Status PENDING  Incomplete  Blood culture (routine x 2)     Status: None (Preliminary result)   Collection Time: 01/08/21 12:30 AM   Specimen: BLOOD  Result Value Ref Range Status   Specimen Description   Final    BLOOD BOTTLES DRAWN AEROBIC AND ANAEROBIC Performed at [facility], [address] [place], [place] [address]    Special Requests   Final    LEFT ANTECUBITAL Blood Culture adequate volume Performed at [facility], [address], [place], [place] [address]    Culture   Final    NO GROWTH 4 DAYS Performed at [facility], [place] [address] [place],  [address]    Report Status PENDING  Incomplete  Resp Panel by RT-PCR (Flu A&B, Covid) Nasopharyngeal Swab     Status: None   Collection Time: 01/08/21  1:27 AM    Specimen: Nasopharyngeal Swab; Nasopharyngeal(NP) swabs in vial transport medium  Result Value Ref Range Status   SARS Coronavirus 2 by RT PCR NEGATIVE NEGATIVE Final    Comment: (NOTE) SARS-CoV-2 target nucleic acids are NOT DETECTED.  The SARS-CoV-2 RNA is generally detectable in upper respiratory specimens during the acute phase of infection. The lowest concentration of SARS-CoV-2 viral copies this assay can detect is 138 copies/mL. A negative result does not preclude SARS-Cov-2 infection and should not be used as the sole basis for treatment or other patient management decisions. A negative result may occur with  improper specimen collection/handling, submission of specimen other than nasopharyngeal swab, presence of viral mutation(s) within the areas targeted by this assay, and inadequate number of viral copies(<138 copies/mL). A negative result must be combined with clinical observations, patient history, and epidemiological information. The expected result is Negative.  Fact Sheet for Patients:  EntrepreneurPulse.com.au  Fact Sheet for Healthcare Providers:  IncredibleEmployment.be  This test is no t yet approved or cleared by the [place] FDA and  has been authorized for detection and/or diagnosis of SARS-CoV-2 by FDA under an Emergency Use Authorization (EUA). This EUA will remain  in effect (meaning this test can be used) for the duration of the COVID-19 declaration under Section 564(b)(1) of the Act, 21 U.S.C.section 360bbb-3(b)(1), unless the authorization is terminated  or revoked sooner.       Influenza A by PCR NEGATIVE NEGATIVE Final   Influenza B by PCR NEGATIVE NEGATIVE Final    Comment: (NOTE) The Xpert Xpress SARS-CoV-2/FLU/RSV plus assay is intended as an aid in the diagnosis of influenza from Nasopharyngeal swab specimens and should not be used as a sole basis for treatment. Nasal washings and aspirates are  unacceptable for Xpert Xpress SARS-CoV-2/FLU/RSV testing.  Fact Sheet for Patients: EntrepreneurPulse.com.au  Fact Sheet for Healthcare Providers: IncredibleEmployment.be  This test is not  yet approved or cleared by the [place] and has been authorized for detection and/or diagnosis of SARS-CoV-2 by FDA under an Emergency Use Authorization (EUA). This EUA will remain in effect (meaning this test can be used) for the duration of the COVID-19 declaration under Section 564(b)(1) of the Act, 21 U.S.C. section 360bbb-3(b)(1), unless the authorization is terminated or revoked.  Performed at [facility], [address] [place],  [address]     Radiology Reports DG Chest 1 View  Result Date: 01/08/2021 CLINICAL DATA:  Peritonitis, leukocytosis EXAM: CHEST  1 VIEW COMPARISON:  07/09/2020 FINDINGS: Lungs are well expanded, symmetric, and clear. No pneumothorax or pleural effusion. Cardiac size within normal limits. Pulmonary vascularity is normal. Osseous structures are age-appropriate. No acute bone abnormality. IMPRESSION: No active disease. Electronically Signed   By: Fidela Salisbury MD   On: 01/08/2021 00:27   CT CHEST W CONTRAST  Result Date: 01/09/2021 CLINICAL DATA:  Cancer of unknown primary. EXAM: CT CHEST WITH CONTRAST TECHNIQUE: Multidetector CT imaging of the chest was performed during intravenous contrast administration. CONTRAST:  90mL OMNIPAQUE IOHEXOL 300 MG/ML  SOLN COMPARISON:  January 08, 2021. FINDINGS: Cardiovascular: Atherosclerosis of thoracic aorta is noted without aneurysm or dissection. Normal cardiac size. No pericardial effusion. Mild coronary calcifications are noted. Mediastinum/Nodes: 4 cm right thyroid mass is noted. Mild diffuse esophageal wall thickening is noted suggesting possible esophagitis. No definite mediastinal or axillary adenopathy is noted. Lungs/Pleura: No pneumothorax or pleural  effusion is noted. Minimal bibasilar subsegmental atelectasis is noted. Upper Abdomen: As noted on prior exam, ascites is noted as well as omental caking suggesting peritoneal carcinomatosis. 11 x 8 mm lymph node is seen superior to the anterior portion of the right hemidiaphragm concerning for possible metastatic disease. Musculoskeletal: No chest wall abnormality. No acute or significant osseous findings. IMPRESSION: 4 cm right thyroid mass is noted. Recommend thyroid US. (Ref: J Am Coll Radiol. 2015 Feb;12(2): 143-50). Mild coronary artery calcifications are noted. Mild diffuse esophageal wall thickening is noted suggesting possible esophagitis. Endoscopy is recommended for further evaluation. 11 x 8 mm lymph node is noted superior to the anterior portion of the right hemidiaphragm concerning for possible metastatic disease. Aortic Atherosclerosis (ICD10-I70.0). Electronically Signed   By: Marijo Conception M.D.   On: 01/09/2021 13:58   CT ABDOMEN PELVIS W CONTRAST  Result Date: 01/08/2021 CLINICAL DATA:  Abdominal pain.  Recent paracentesis EXAM: CT ABDOMEN AND PELVIS WITH CONTRAST TECHNIQUE: Multidetector CT imaging of the abdomen and pelvis was performed using the standard protocol following bolus administration of intravenous contrast. CONTRAST:  24mL OMNIPAQUE IOHEXOL 300 MG/ML  SOLN COMPARISON:  None. FINDINGS: Lower chest: No acute abnormality. Hepatobiliary: Small cyst in the inferior right hepatic lobe. No suspicious focal hepatic abnormality. Gallbladder unremarkable. Pancreas: No focal abnormality or ductal dilatation. Spleen: No focal abnormality.  Normal size. Adrenals/Urinary Tract: Left renal parapelvic cysts. No hydronephrosis. No renal or adrenal mass. Small diverticulum off the posterior left bladder wall. No bladder wall thickening. Stomach/Bowel: Scattered colonic diverticulosis. No bowel obstruction. Stomach and small bowel decompressed, grossly unremarkable. Vascular/Lymphatic: Calcified  aorta. No evidence of aneurysm or adenopathy. Reproductive: Mildly prominent prostate. Other: Moderate ascites throughout the abdomen and pelvis. There is omental thickening/caking. Peritoneal mass seen in the left upper quadrant adjacent to the spleen measuring up to 7 cm. Other peritoneal masses adjacent to the splenic flexure of the colon measuring 3.2 cm. Musculoskeletal: No acute bony abnormality. IMPRESSION: Moderate ascites with omental thickening/caking and multiple  peritoneal cystic masses. 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 Aortic atherosclerosis. Electronically Signed   By: Rolm Baptise M.D.   On: 01/08/2021 00:47   CT BIOPSY  Result Date: 01/10/2021 INDICATION: No known primary, now with concern for omental caking and malignant ascites. Please perform CT-guided biopsy of omental mass within the left upper abdominal quadrant and CT-guided paracentesis. EXAM: 1. CT-GUIDED BIOPSY OF OMENTAL MASS 2. CT-GUIDED PARACENTESIS COMPARISON:  CT abdomen and pelvis-01/08/2021 MEDICATIONS: None. ANESTHESIA/SEDATION: Fentanyl 25 mcg IV; Versed 0.5 mg IV Sedation time: 12 minutes; The patient was continuously monitored during the procedure by the interventional radiology nurse under my direct supervision. CONTRAST:  None. COMPLICATIONS: None immediate. PROCEDURE: Informed consent was obtained from the patient following an explanation of the procedure, risks, benefits and alternatives. A time out was performed prior to the initiation of the procedure. The patient was positioned supine on the CT table and a limited CT was performed for procedural planning demonstrating unchanged size and appearance of the approximately 3.6 x 3.4 cm omental mass within the left upper abdominal quadrant (image 45) as well as recurrent at least moderate volume intra-abdominal ascites. The procedures were planned. The operative sites were prepped and draped in the usual sterile fashion. A spot along the inferolateral  aspect of the left abdomen was anesthetized with 1% lidocaine with epinephrine allowing for placement of a Safe-T-Centesis catheter. Appropriate position was confirmed with CT imaging and the paracentesis was initiated. Next, the omental mass with the left upper abdominal quadrant was targeted with a 17 gauge coaxial needle after the adjacent subcutaneous tissues were anesthetized 1% lidocaine with epinephrine. Appropriate position was confirmed with CT imaging. Next, 8 core needle biopsy samples were obtained with an 18 gauge core needle biopsy device. The co-axial needle was removed and hemostasis was achieved with manual compression. The paracentesis was completed and the CT centesis catheter was removed intact. A limited postprocedural CT was negative for hemorrhage or additional complication. A dressing was applied. The patient tolerated the procedure well without immediate postprocedural complication. IMPRESSION: 1. Technically successful CT guided core needle biopsy of omental mass within the left upper abdominal quadrant. 2. Technically successful CT guided paracentesis yielding 4.4 L of serous fluid. Electronically Signed   By: Sandi Mariscal M.D.   On: 01/10/2021 15:40   CT Paracentesis  Result Date: 01/10/2021 INDICATION: No known primary, now with concern for omental caking and malignant ascites. Please perform CT-guided biopsy of omental mass within the left upper abdominal quadrant and CT-guided paracentesis. EXAM: 1. CT-GUIDED BIOPSY OF OMENTAL MASS 2. CT-GUIDED PARACENTESIS COMPARISON:  CT abdomen and pelvis-01/08/2021 MEDICATIONS: None. ANESTHESIA/SEDATION: Fentanyl 25 mcg IV; Versed 0.5 mg IV Sedation time: 12 minutes; The patient was continuously monitored during the procedure by the interventional radiology nurse under my direct supervision. CONTRAST:  None. COMPLICATIONS: None immediate. PROCEDURE: Informed consent was obtained from the patient following an explanation of the procedure, risks,  benefits and alternatives. A time out was performed prior to the initiation of the procedure. The patient was positioned supine on the CT table and a limited CT was performed for procedural planning demonstrating unchanged size and appearance of the approximately 3.6 x 3.4 cm omental mass within the left upper abdominal quadrant (image 45) as well as recurrent at least moderate volume intra-abdominal ascites. The procedures were planned. The operative sites were prepped and draped in the usual sterile fashion. A spot along the inferolateral aspect of the left abdomen was anesthetized with 1% lidocaine with epinephrine allowing  for placement of a Safe-T-Centesis catheter. Appropriate position was confirmed with CT imaging and the paracentesis was initiated. Next, the omental mass with the left upper abdominal quadrant was targeted with a 17 gauge coaxial needle after the adjacent subcutaneous tissues were anesthetized 1% lidocaine with epinephrine. Appropriate position was confirmed with CT imaging. Next, 8 core needle biopsy samples were obtained with an 18 gauge core needle biopsy device. The co-axial needle was removed and hemostasis was achieved with manual compression. The paracentesis was completed and the CT centesis catheter was removed intact. A limited postprocedural CT was negative for hemorrhage or additional complication. A dressing was applied. The patient tolerated the procedure well without immediate postprocedural complication. IMPRESSION: 1. Technically successful CT guided core needle biopsy of omental mass within the left upper abdominal quadrant. 2. Technically successful CT guided paracentesis yielding 4.4 L of serous fluid. Electronically Signed   By: Sandi Mariscal M.D.   On: 01/10/2021 15:40   US THYROID  Result Date: 01/10/2021 CLINICAL DATA:  Incidental on CT. EXAM: THYROID ULTRASOUND TECHNIQUE: Ultrasound examination of the thyroid gland and adjacent soft tissues was performed. COMPARISON:   None. FINDINGS: Parenchymal Echotexture: Mildly heterogenous Isthmus: 0.4 cm Right lobe: 4.5 x 3.1 x 3.0 cm Left lobe: 2.9 x 1.5 x 1.0 cm _________________________________________________________ Estimated total number of nodules >/= 1 cm: 1 Number of spongiform nodules >/=  2 cm not described below (TR1): 0 Number of mixed cystic and solid nodules >/= 1.5 cm not described below (TR2): 0 _________________________________________________________ Nodule # 1: Location: Right; mid Maximum size: 3.7 cm; Other 2 dimensions: 2.9 x 3.5 cm Composition: solid/almost completely solid (2) Echogenicity: isoechoic (1) Shape: taller-than-wide (3) Margins: ill-defined (0) Echogenic foci: none (0) ACR TI-RADS total points: 6. ACR TI-RADS risk category: TR4 (4-6 points). ACR TI-RADS recommendations: **Given size (>/= 1.5 cm) and appearance, fine needle aspiration of this moderately suspicious nodule should be considered based on TI-RADS criteria. _________________________________________________________ Subcentimeter left thyroid nodule does not meet criteria for FNA or surveillance. IMPRESSION: Nodule 1 (TI-RADS 4) located in the mid right thyroid lobe should be further evaluated with FNA. The above is in keeping with the ACR TI-RADS recommendations - J Am Coll Radiol 2017;14:587-595. Electronically Signed   By: Miachel Roux M.D.   On: 01/10/2021 11:41   IR Paracentesis  Result Date: 01/06/2021 INDICATION: Abdominal distention with ascites. Request for therapeutic and diagnostic paracentesis. EXAM: ULTRASOUND GUIDED RIGHT LOWER QUADRANT PARACENTESIS MEDICATIONS: % plain lidocaine, 5 mL COMPLICATIONS: None immediate. PROCEDURE: Informed written consent was obtained from the patient after a discussion of the risks, benefits and alternatives to treatment. A timeout was performed prior to the initiation of the procedure. Initial ultrasound scanning demonstrates a large amount of ascites within the right lower abdominal quadrant.  The right lower abdomen was prepped and draped in the usual sterile fashion. 1% lidocaine was used for local anesthesia. Following this, a 6 Fr Safe-T-Centesis catheter was introduced. An ultrasound image was saved for documentation purposes. The paracentesis was performed. The catheter was removed and a dressing was applied. The patient tolerated the procedure well without immediate post procedural complication. FINDINGS: A total of approximately 4.2 L of hazy, serosanguineous fluid was removed. Samples were sent to the laboratory as requested by the clinical team. IMPRESSION: Successful ultrasound-guided paracentesis yielding 4.2 liters of peritoneal fluid. Read by: Ascencion Dike PA-C Electronically Signed   By: Ruthann Cancer MD   On: 01/06/2021 12:39    Lab Data:  CBC: Recent Labs  Lab 01/04/2021 2047 01/09/21 0540 01/10/21 0513 01/11/21 0640  WBC 11.7* 12.6* 14.3* 16.6*  NEUTROABS 9.5*  --   --   --   HGB 17.4* 17.1* 17.1* 16.8  HCT 51.5 52.4* 52.5* 51.1  MCV 90.2 93.4 94.3 92.7  PLT 408* 448* 411* 280   Basic Metabolic Panel: Recent Labs  Lab 12/31/2020 2047 01/09/21 0540 01/10/21 0513 01/11/21 0640 01/12/21 0521  NA 127* 129* 128* 128* 130*  K 5.3* 4.8 5.0 4.5 5.7*  CL 95* 99 100 99 101  CO2 18* 18* 14* 18* 16*  GLUCOSE 142* 130* 129* 123* 113*  BUN 73* 83* 90* 84* 88*  CREATININE 1.46* 1.67* 1.63* 1.56* 1.69*  CALCIUM 9.0 8.4* 8.3* 8.4* 8.9   GFR: Estimated Creatinine Clearance: 24 mL/min (A) (by C-G formula based on SCr of 1.69 mg/dL (H)). Liver Function Tests: Recent Labs  Lab 12/16/2020 2047 01/10/21 0513  AST 32 27  ALT 25 29  ALKPHOS 92 90  BILITOT 0.5 0.7  PROT 6.0* 5.8*  ALBUMIN 3.2* 2.7*   No results for input(s): LIPASE, AMYLASE in the last 168 hours. No results for input(s): AMMONIA in the last 168 hours. Coagulation Profile: No results for input(s): INR, PROTIME in the last 168 hours. Cardiac Enzymes: No results for input(s): CKTOTAL, CKMB, CKMBINDEX,  TROPONINI in the last 168 hours. BNP (last 3 results) No results for input(s): PROBNP in the last 8760 hours. HbA1C: No results for input(s): HGBA1C in the last 72 hours. CBG: No results for input(s): GLUCAP in the last 168 hours. Lipid Profile: No results for input(s): CHOL, HDL, LDLCALC, TRIG, CHOLHDL, LDLDIRECT in the last 72 hours. Thyroid Function Tests: Recent Labs    01/10/21 0936 01/11/21 0640  TSH  --  2.849  FREET4 0.81  --    Anemia Panel: No results for input(s): VITAMINB12, FOLATE, FERRITIN, TIBC, IRON, RETICCTPCT in the last 72 hours. Urine analysis:    Component Value Date/Time   COLORURINE YELLOW 01/08/2021 0625   APPEARANCEUR CLEAR 01/08/2021 0625   LABSPEC >1.046 (H) 01/08/2021 0625   PHURINE 6.0 01/08/2021 0625   GLUCOSEU NEGATIVE 01/08/2021 0625   HGBUR NEGATIVE 01/08/2021 0625   BILIRUBINUR NEGATIVE 01/08/2021 0625   KETONESUR 15 (A) 01/08/2021 0625   PROTEINUR 30 (A) 01/08/2021 0625   NITRITE NEGATIVE 01/08/2021 0625   LEUKOCYTESUR NEGATIVE 01/08/2021 0625     Alphonsine Minium M.D. [facility] 01/12/2021, 1:21 PM  Available via Epic secure chat 7am-7pm expressing concerns After 7 pm, please refer to night coverage provider listed on amion.

## 2021-01-12 NOTE — Progress Notes (Signed)
Family has been at patient's bedside all day. Stephen Turner is alert and oriented, very weak and frail looking. Asked if he would like to sit up in the chair he said he feels too weak and tired to do anything. Asked if I could reposition him off his back and he refused wanting to be left on his back. Patient has been sleeping for long intervals.

## 2021-01-13 DIAGNOSIS — Z515 Encounter for palliative care: Secondary | ICD-10-CM | POA: Diagnosis not present

## 2021-01-13 DIAGNOSIS — N179 Acute kidney failure, unspecified: Secondary | ICD-10-CM | POA: Diagnosis not present

## 2021-01-13 DIAGNOSIS — C786 Secondary malignant neoplasm of retroperitoneum and peritoneum: Secondary | ICD-10-CM | POA: Diagnosis not present

## 2021-01-13 DIAGNOSIS — Z7189 Other specified counseling: Secondary | ICD-10-CM | POA: Diagnosis not present

## 2021-01-13 DIAGNOSIS — R180 Malignant ascites: Secondary | ICD-10-CM

## 2021-01-13 DIAGNOSIS — R1010 Upper abdominal pain, unspecified: Secondary | ICD-10-CM | POA: Diagnosis not present

## 2021-01-13 LAB — BASIC METABOLIC PANEL
Anion gap: 13 (ref 5–15)
BUN: 92 mg/dL — ABNORMAL HIGH (ref 8–23)
CO2: 15 mmol/L — ABNORMAL LOW (ref 22–32)
Calcium: 9.7 mg/dL (ref 8.9–10.3)
Chloride: 101 mmol/L (ref 98–111)
Creatinine, Ser: 2.28 mg/dL — ABNORMAL HIGH (ref 0.61–1.24)
GFR, Estimated: 26 mL/min — ABNORMAL LOW (ref >60)
Glucose, Bld: 134 mg/dL — ABNORMAL HIGH (ref 70–99)
Potassium: 5.8 mmol/L — ABNORMAL HIGH (ref 3.5–5.1)
Sodium: 129 mmol/L — ABNORMAL LOW (ref 135–145)

## 2021-01-13 LAB — CULTURE, BLOOD (ROUTINE X 2)
Culture: NO GROWTH
Culture: NO GROWTH
Special Requests: ADEQUATE
Special Requests: ADEQUATE

## 2021-01-13 MED ORDER — GLYCOPYRROLATE 0.2 MG/ML IJ SOLN: 0.2000 mg | INTRAMUSCULAR | Status: DC | PRN

## 2021-01-13 MED ORDER — SODIUM CHLORIDE 0.9 % IV SOLN: 250.0000 mL | INTRAVENOUS | Status: DC | PRN

## 2021-01-13 MED ORDER — LORAZEPAM 2 MG/ML IJ SOLN: 1.0000 mg | INTRAMUSCULAR | Status: DC | PRN

## 2021-01-13 MED ORDER — MORPHINE 100MG IN NS 100ML (1MG/ML) PREMIX INFUSION
2.0000 mg/h | INTRAVENOUS | Status: DC
  Administered 2021-01-13: 1 mg/h via INTRAVENOUS
  Filled 2021-01-13: qty 100

## 2021-01-13 MED ORDER — MORPHINE BOLUS VIA INFUSION
2.0000 mg | INTRAVENOUS | Status: DC | PRN
  Administered 2021-01-13: 2 mg via INTRAVENOUS
  Filled 2021-01-13: qty 2

## 2021-01-13 MED ORDER — BIOTENE DRY MOUTH MT LIQD: 15.0000 mL | OROMUCOSAL | Status: DC | PRN

## 2021-01-13 MED ORDER — GLYCOPYRROLATE 0.2 MG/ML IJ SOLN
0.2000 mg | Freq: Once | INTRAMUSCULAR | Status: AC
  Administered 2021-01-13: 0.2 mg via INTRAVENOUS
  Filled 2021-01-13: qty 1

## 2021-01-13 MED ORDER — GLYCOPYRROLATE 1 MG PO TABS: 1.0000 mg | ORAL_TABLET | ORAL | Status: DC | PRN

## 2021-01-13 MED ORDER — LORAZEPAM 1 MG PO TABS: 1.0000 mg | ORAL_TABLET | ORAL | Status: DC | PRN

## 2021-01-13 MED ORDER — SODIUM CHLORIDE 0.9% FLUSH: 3.0000 mL | INTRAVENOUS | Status: DC | PRN

## 2021-01-13 MED ORDER — ACETAMINOPHEN 325 MG PO TABS: 650.0000 mg | ORAL_TABLET | Freq: Four times a day (QID) | ORAL | Status: DC | PRN

## 2021-01-13 MED ORDER — POLYVINYL ALCOHOL 1.4 % OP SOLN
1.0000 [drp] | Freq: Four times a day (QID) | OPHTHALMIC | Status: DC | PRN
  Filled 2021-01-13: qty 15

## 2021-01-13 MED ORDER — ONDANSETRON HCL 4 MG/2ML IJ SOLN: 4.0000 mg | Freq: Four times a day (QID) | INTRAMUSCULAR | Status: DC | PRN

## 2021-01-13 MED ORDER — ONDANSETRON 4 MG PO TBDP: 4.0000 mg | ORAL_TABLET | Freq: Four times a day (QID) | ORAL | Status: DC | PRN

## 2021-01-13 MED ORDER — SODIUM CHLORIDE 0.9% FLUSH: 3.0000 mL | Freq: Two times a day (BID) | INTRAVENOUS | Status: DC

## 2021-01-13 MED ORDER — ACETAMINOPHEN 650 MG RE SUPP: 650.0000 mg | Freq: Four times a day (QID) | RECTAL | Status: DC | PRN

## 2021-01-13 MED ORDER — SCOPOLAMINE 1 MG/3DAYS TD PT72
1.0000 | MEDICATED_PATCH | TRANSDERMAL | Status: DC
  Administered 2021-01-13: 1.5 mg via TRANSDERMAL
  Filled 2021-01-13: qty 1

## 2021-01-13 MED ORDER — LORAZEPAM 2 MG/ML PO CONC: 1.0000 mg | ORAL | Status: DC | PRN

## 2021-01-15 LAB — CYTOLOGY - NON PAP

## 2021-01-17 LAB — SURGICAL PATHOLOGY

## 2021-02-10 NOTE — Death Summary Note (Signed)
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  Patient had a recent paracentesis done on 01/06/2021 prior to the admission and had drained 4.2 L of fluid.  He was contacted and advised to go to the ER due to concerns of possible SBP.  Patient complained of diffuse abdominal pain associated with poor oral intake and decreased urine output.  Patient had low-grade fevers at home, temp of 100 F. CT abdomen pelvis showed moderate ascites with omental thickening/caking and multiple peritoneal cystic masses concerning for peritoneal carcinomatosis Patient was admitted for further work-up.    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 Tumor markers showed CA 19-9 71, PSA 5.95, CEA normal -CT chest showed right thyroid mass, possible esophagitis.  11x 8 mm lymph node in anterior portion of the right hemidiaphragm concerning  for possible metastatic disease -Underwent CT-guided biopsy per IR on 7/1.  Biopsy results are still pending -Right thyroid lobe with 3.7 cm nodule, recommended FNAC given size> 1.5 cm.  Thyroid function fairly stable.  Discussed with the patient and daughter, no further invasive procedures. -Patient was followed by palliative medicine closely.  Per family's wishes, patient was placed on comfort care measures, morphine drip.  - Patient passed on Jan 14, 2021 at 12:43 PM with family at the bedside   New onset ascites with concern for SBP secondary to peritoneal carcinomatosis -Patient presented with new onset ascites, status post recent paracentesis on 6/27, low-grade fevers, poor oral intake, was noted to have 80% neutrophils with ascitic fluid turbid, concerning for SBP.  Patient was placed on IV Rocephin. -He underwent 2nd paracentesis on 7/1, drained 4.4 L, 88% neutrophils, -On 7/3, patient deteriorated further overnight and was noted to have terminal secretions, shortness of breath, family at the bedside.  -With family's wishes, patient was placed on complete comfort care goals, initiated on IV morphine drip, scopolamine and Robinul.      Dehydration with hyponatremia, poor oral intake, lactic acidosis, hyperkalemia -Comfort measures   Acute kidney injury -Likely due to peritoneal carcinomatosis, malignancy, poor oral intake  -Comfort care goals       Physical deconditioning, FTT, insomnia with poor appetite -Likely due to malignancy, patient was placed on Remeron    Pertinent Labs and Studies  Significant Diagnostic Studies DG Chest 1 View  Result Date: 01/08/2021 CLINICAL DATA:  Peritonitis, leukocytosis EXAM: CHEST  1 VIEW COMPARISON:  07/09/2020 FINDINGS: Lungs are well expanded, symmetric, and clear. No pneumothorax or pleural effusion. Cardiac size within normal limits. Pulmonary vascularity is normal.  Osseous structures are age-appropriate. No acute bone abnormality. IMPRESSION:  No active disease. Electronically Signed   By: Fidela Salisbury MD   On: 01/08/2021 00:27   CT CHEST W CONTRAST  Result Date: 01/09/2021 CLINICAL DATA:  Cancer of unknown primary. EXAM: CT CHEST WITH CONTRAST TECHNIQUE: Multidetector CT imaging of the chest was performed during intravenous contrast administration. CONTRAST:  35mL OMNIPAQUE IOHEXOL 300 MG/ML  SOLN COMPARISON:  January 08, 2021. FINDINGS: Cardiovascular: Atherosclerosis of thoracic aorta is noted without aneurysm or dissection. Normal cardiac size. No pericardial effusion. Mild coronary calcifications are noted. Mediastinum/Nodes: 4 cm right thyroid mass is noted. Mild diffuse esophageal wall thickening is noted suggesting possible esophagitis. No definite mediastinal or axillary adenopathy is noted. Lungs/Pleura: No pneumothorax or pleural effusion is noted. Minimal bibasilar subsegmental atelectasis is noted. Upper Abdomen: As noted on prior exam, ascites is noted as well as omental caking suggesting peritoneal carcinomatosis. 11 x 8 mm lymph node is seen superior to the anterior portion of the right hemidiaphragm concerning for possible metastatic disease. Musculoskeletal: No chest wall abnormality. No acute or significant osseous findings. IMPRESSION: 4 cm right thyroid mass is noted. Recommend thyroid US. (Ref: J Am Coll Radiol. 2015 Feb;12(2): 143-50). Mild coronary artery calcifications are noted. Mild diffuse esophageal wall thickening is noted suggesting possible esophagitis. Endoscopy is recommended for further evaluation. 11 x 8 mm lymph node is noted superior to the anterior portion of the right hemidiaphragm concerning for possible metastatic disease. Aortic Atherosclerosis (ICD10-I70.0). Electronically Signed   By: Marijo Conception M.D.   On: 01/09/2021 13:58   CT ABDOMEN PELVIS W CONTRAST  Result Date: 01/08/2021 CLINICAL DATA:  Abdominal pain.  Recent paracentesis EXAM: CT ABDOMEN AND PELVIS WITH CONTRAST TECHNIQUE: Multidetector CT  imaging of the abdomen and pelvis was performed using the standard protocol following bolus administration of intravenous contrast. CONTRAST:  29mL OMNIPAQUE IOHEXOL 300 MG/ML  SOLN COMPARISON:  None. FINDINGS: Lower chest: No acute abnormality. Hepatobiliary: Small cyst in the inferior right hepatic lobe. No suspicious focal hepatic abnormality. Gallbladder unremarkable. Pancreas: No focal abnormality or ductal dilatation. Spleen: No focal abnormality.  Normal size. Adrenals/Urinary Tract: Left renal parapelvic cysts. No hydronephrosis. No renal or adrenal mass. Small diverticulum off the posterior left bladder wall. No bladder wall thickening. Stomach/Bowel: Scattered colonic diverticulosis. No bowel obstruction. Stomach and small bowel decompressed, grossly unremarkable. Vascular/Lymphatic: Calcified aorta. No evidence of aneurysm or adenopathy. Reproductive: Mildly prominent prostate. Other: Moderate ascites throughout the abdomen and pelvis. There is omental thickening/caking. Peritoneal mass seen in the left upper quadrant adjacent to the spleen measuring up to 7 cm. Other peritoneal masses adjacent to the splenic flexure of the colon measuring 3.2 cm. Musculoskeletal: No acute bony abnormality. IMPRESSION: Moderate ascites with omental thickening/caking and multiple peritoneal cystic masses. 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 Aortic atherosclerosis. Electronically Signed   By: Rolm Baptise M.D.   On: 01/08/2021 00:47   CT BIOPSY  Result Date: 01/10/2021 INDICATION: No known primary, now with concern for omental caking and malignant ascites. Please perform CT-guided biopsy of omental mass within the left upper abdominal quadrant and CT-guided paracentesis. EXAM: 1. CT-GUIDED BIOPSY OF OMENTAL MASS 2. CT-GUIDED PARACENTESIS COMPARISON:  CT abdomen and pelvis-01/08/2021 MEDICATIONS: None. ANESTHESIA/SEDATION: Fentanyl 25 mcg IV; Versed 0.5 mg IV Sedation time: 12 minutes; The patient was  continuously monitored during the procedure by the interventional radiology nurse under my direct supervision. CONTRAST:  None. COMPLICATIONS: None immediate. PROCEDURE: Informed consent was obtained from the patient  following an explanation of the procedure, risks, benefits and alternatives. A time out was performed prior to the initiation of the procedure. The patient was positioned supine on the CT table and a limited CT was performed for procedural planning demonstrating unchanged size and appearance of the approximately 3.6 x 3.4 cm omental mass within the left upper abdominal quadrant (image 45) as well as recurrent at least moderate volume intra-abdominal ascites. The procedures were planned. The operative sites were prepped and draped in the usual sterile fashion. A spot along the inferolateral aspect of the left abdomen was anesthetized with 1% lidocaine with epinephrine allowing for placement of a Safe-T-Centesis catheter. Appropriate position was confirmed with CT imaging and the paracentesis was initiated. Next, the omental mass with the left upper abdominal quadrant was targeted with a 17 gauge coaxial needle after the adjacent subcutaneous tissues were anesthetized 1% lidocaine with epinephrine. Appropriate position was confirmed with CT imaging. Next, 8 core needle biopsy samples were obtained with an 18 gauge core needle biopsy device. The co-axial needle was removed and hemostasis was achieved with manual compression. The paracentesis was completed and the CT centesis catheter was removed intact. A limited postprocedural CT was negative for hemorrhage or additional complication. A dressing was applied. The patient tolerated the procedure well without immediate postprocedural complication. IMPRESSION: 1. Technically successful CT guided core needle biopsy of omental mass within the left upper abdominal quadrant. 2. Technically successful CT guided paracentesis yielding 4.4 L of serous fluid.  Electronically Signed   By: Sandi Mariscal M.D.   On: 01/10/2021 15:40   DG CHEST PORT 1 VIEW  Result Date: 01/12/2021 CLINICAL DATA:  Shortness of breath and hypertension. Former smoker. EXAM: PORTABLE CHEST 1 VIEW COMPARISON:  01/08/2021 FINDINGS: Slightly shallow inspiration. Heart size and pulmonary vascularity are normal. Emphysematous changes are suggested in the lungs. No airspace disease or consolidation. No pleural effusions. No pneumothorax. Mediastinal contours appear intact. Calcification of the aorta. IMPRESSION: No active disease. Electronically Signed   By: Lucienne Capers M.D.   On: 01/12/2021 21:15   CT Paracentesis  Result Date: 01/10/2021 INDICATION: No known primary, now with concern for omental caking and malignant ascites. Please perform CT-guided biopsy of omental mass within the left upper abdominal quadrant and CT-guided paracentesis. EXAM: 1. CT-GUIDED BIOPSY OF OMENTAL MASS 2. CT-GUIDED PARACENTESIS COMPARISON:  CT abdomen and pelvis-01/08/2021 MEDICATIONS: None. ANESTHESIA/SEDATION: Fentanyl 25 mcg IV; Versed 0.5 mg IV Sedation time: 12 minutes; The patient was continuously monitored during the procedure by the interventional radiology nurse under my direct supervision. CONTRAST:  None. COMPLICATIONS: None immediate. PROCEDURE: Informed consent was obtained from the patient following an explanation of the procedure, risks, benefits and alternatives. A time out was performed prior to the initiation of the procedure. The patient was positioned supine on the CT table and a limited CT was performed for procedural planning demonstrating unchanged size and appearance of the approximately 3.6 x 3.4 cm omental mass within the left upper abdominal quadrant (image 45) as well as recurrent at least moderate volume intra-abdominal ascites. The procedures were planned. The operative sites were prepped and draped in the usual sterile fashion. A spot along the inferolateral aspect of the left  abdomen was anesthetized with 1% lidocaine with epinephrine allowing for placement of a Safe-T-Centesis catheter. Appropriate position was confirmed with CT imaging and the paracentesis was initiated. Next, the omental mass with the left upper abdominal quadrant was targeted with a 17 gauge coaxial needle after the adjacent subcutaneous tissues  were anesthetized 1% lidocaine with epinephrine. Appropriate position was confirmed with CT imaging. Next, 8 core needle biopsy samples were obtained with an 18 gauge core needle biopsy device. The co-axial needle was removed and hemostasis was achieved with manual compression. The paracentesis was completed and the CT centesis catheter was removed intact. A limited postprocedural CT was negative for hemorrhage or additional complication. A dressing was applied. The patient tolerated the procedure well without immediate postprocedural complication. IMPRESSION: 1. Technically successful CT guided core needle biopsy of omental mass within the left upper abdominal quadrant. 2. Technically successful CT guided paracentesis yielding 4.4 L of serous fluid. Electronically Signed   By: Sandi Mariscal M.D.   On: 01/10/2021 15:40   US THYROID  Result Date: 01/10/2021 CLINICAL DATA:  Incidental on CT. EXAM: THYROID ULTRASOUND TECHNIQUE: Ultrasound examination of the thyroid gland and adjacent soft tissues was performed. COMPARISON:  None. FINDINGS: Parenchymal Echotexture: Mildly heterogenous Isthmus: 0.4 cm Right lobe: 4.5 x 3.1 x 3.0 cm Left lobe: 2.9 x 1.5 x 1.0 cm _________________________________________________________ Estimated total number of nodules >/= 1 cm: 1 Number of spongiform nodules >/=  2 cm not described below (TR1): 0 Number of mixed cystic and solid nodules >/= 1.5 cm not described below (TR2): 0 _________________________________________________________ Nodule # 1: Location: Right; mid Maximum size: 3.7 cm; Other 2 dimensions: 2.9 x 3.5 cm Composition: solid/almost  completely solid (2) Echogenicity: isoechoic (1) Shape: taller-than-wide (3) Margins: ill-defined (0) Echogenic foci: none (0) ACR TI-RADS total points: 6. ACR TI-RADS risk category: TR4 (4-6 points). ACR TI-RADS recommendations: **Given size (>/= 1.5 cm) and appearance, fine needle aspiration of this moderately suspicious nodule should be considered based on TI-RADS criteria. _________________________________________________________ Subcentimeter left thyroid nodule does not meet criteria for FNA or surveillance. IMPRESSION: Nodule 1 (TI-RADS 4) located in the mid right thyroid lobe should be further evaluated with FNA. The above is in keeping with the ACR TI-RADS recommendations - J Am Coll Radiol 2017;14:587-595. Electronically Signed   By: Miachel Roux M.D.   On: 01/10/2021 11:41   IR Paracentesis  Result Date: 01/06/2021 INDICATION: Abdominal distention with ascites. Request for therapeutic and diagnostic paracentesis. EXAM: ULTRASOUND GUIDED RIGHT LOWER QUADRANT PARACENTESIS MEDICATIONS: % plain lidocaine, 5 mL COMPLICATIONS: None immediate. PROCEDURE: Informed written consent was obtained from the patient after a discussion of the risks, benefits and alternatives to treatment. A timeout was performed prior to the initiation of the procedure. Initial ultrasound scanning demonstrates a large amount of ascites within the right lower abdominal quadrant. The right lower abdomen was prepped and draped in the usual sterile fashion. 1% lidocaine was used for local anesthesia. Following this, a 6 Fr Safe-T-Centesis catheter was introduced. An ultrasound image was saved for documentation purposes. The paracentesis was performed. The catheter was removed and a dressing was applied. The patient tolerated the procedure well without immediate post procedural complication. FINDINGS: A total of approximately 4.2 L of hazy, serosanguineous fluid was removed. Samples were sent to the laboratory as requested by the clinical  team. IMPRESSION: Successful ultrasound-guided paracentesis yielding 4.2 liters of peritoneal fluid. Read by: Ascencion Dike PA-C Electronically Signed   By: Ruthann Cancer MD   On: 01/06/2021 12:39    Microbiology Recent Results (from the past 240 hour(s))  Acid Fast Smear (AFB)     Status: None   Collection Time: 01/06/21 12:26 PM   Specimen: Abdomen; Peritoneal Fluid  Result Value Ref Range Status   AFB Specimen Processing Concentration  Final  Acid Fast Smear Negative  Final    Comment: (NOTE) Performed At: [facility] [place], [place] [id number] Rush Farmer MD FI:[id number]    Source (AFB) ABDOMEN  Final    Comment: Performed at [facility], [place] [address], [place], [place] [address]  Blood culture (routine x 2)     Status: None   Collection Time: 01/08/21 12:30 AM   Specimen: BLOOD  Result Value Ref Range Status   Specimen Description   Final    BLOOD BOTTLES DRAWN AEROBIC AND ANAEROBIC Performed at [facility], [address], [place], [place] [address]    Special Requests   Final    BLOOD RIGHT HAND Blood Culture adequate volume Performed at [facility], [address] [place], [place] [address]    Culture   Final    NO GROWTH 5 DAYS Performed at [facility], [place] [address], [place], [place] [address]    Report Status 01-26-2021 FINAL  Final  Blood culture (routine x 2)     Status: None   Collection Time: 01/08/21 12:30 AM   Specimen: BLOOD  Result Value Ref Range Status   Specimen Description   Final    BLOOD BOTTLES DRAWN AEROBIC AND ANAEROBIC Performed at [facility], [address] [place], [place] [address]    Special Requests   Final    LEFT ANTECUBITAL Blood Culture adequate volume Performed at [facility], [address] [place], [place] [address]    Culture   Final    NO GROWTH 5 DAYS Performed at [facility], [place] [address], [place], [place] [address]    Report Status 2021-01-26 FINAL  Final  Resp Panel by RT-PCR (Flu A&B, Covid) Nasopharyngeal Swab     Status: None   Collection Time: 01/08/21  1:27 AM   Specimen: Nasopharyngeal Swab; Nasopharyngeal(NP) swabs in vial transport medium  Result Value Ref Range Status   SARS Coronavirus 2 by RT PCR NEGATIVE NEGATIVE Final    Comment: (NOTE) SARS-CoV-2 target nucleic acids are NOT DETECTED.  The SARS-CoV-2 RNA is generally detectable in upper respiratory specimens during the acute phase of infection. The lowest concentration of SARS-CoV-2 viral copies this assay can detect is 138 copies/mL. A negative result does not preclude SARS-Cov-2 infection and should not be used as the sole basis for treatment or other patient management decisions. A negative result may occur with  improper specimen collection/handling, submission of specimen other than nasopharyngeal swab, presence of viral mutation(s) within the areas targeted by this assay, and inadequate number of viral copies(<138 copies/mL). A negative result must be combined with clinical observations, patient history, and epidemiological information. The expected result is Negative.  Fact Sheet for Patients:  EntrepreneurPulse.com.au  Fact Sheet for Healthcare Providers:  IncredibleEmployment.be  This test is no t yet approved or cleared by the [place] FDA and  has been authorized for detection and/or diagnosis of SARS-CoV-2 by FDA under an Emergency Use Authorization (EUA). This EUA will remain  in effect (meaning this test can be used) for the duration of the COVID-19 declaration under Section 564(b)(1) of the Act, 21 U.S.C.section 360bbb-3(b)(1), unless the authorization is terminated  or revoked sooner.       Influenza A by PCR NEGATIVE NEGATIVE Final   Influenza B by PCR NEGATIVE NEGATIVE Final    Comment: (NOTE) The Xpert Xpress SARS-CoV-2/FLU/RSV  plus assay is intended as an aid in the diagnosis of  influenza from Nasopharyngeal swab specimens and should not be used as a sole basis for treatment. Nasal washings and aspirates are unacceptable for Xpert Xpress SARS-CoV-2/FLU/RSV testing.  Fact Sheet for Patients: EntrepreneurPulse.com.au  Fact Sheet for Healthcare Providers: IncredibleEmployment.be  This test is not yet approved or cleared by the [place] FDA and has been authorized for detection and/or diagnosis of SARS-CoV-2 by FDA under an Emergency Use Authorization (EUA). This EUA will remain in effect (meaning this test can be used) for the duration of the COVID-19 declaration under Section 564(b)(1) of the Act, 21 U.S.C. section 360bbb-3(b)(1), unless the authorization is terminated or revoked.  Performed at [facility], [address], [place], [place] [address]     Lab Basic Metabolic Panel: Recent Labs  Lab 01/09/21 0540 01/10/21 0513 01/11/21 0640 01/12/21 0521 01/12/21 2056 Jan 20, 2021 0448  NA 129* 128* 128* 130*  --  129*  K 4.8 5.0 4.5 5.7* 5.5* 5.8*  CL 99 100 99 101  --  101  CO2 18* 14* 18* 16*  --  15*  GLUCOSE 130* 129* 123* 113*  --  134*  BUN 83* 90* 84* 88*  --  92*  CREATININE 1.67* 1.63* 1.56* 1.69*  --  2.28*  CALCIUM 8.4* 8.3* 8.4* 8.9  --  9.7   Liver Function Tests: Recent Labs  Lab 12/13/2020 2047 01/10/21 0513  AST 32 27  ALT 25 29  ALKPHOS 92 90  BILITOT 0.5 0.7  PROT 6.0* 5.8*  ALBUMIN 3.2* 2.7*   No results for input(s): LIPASE, AMYLASE in the last 168 hours. No results for input(s): AMMONIA in the last 168 hours. CBC: Recent Labs  Lab 12/25/2020 2047 01/09/21 0540 01/10/21 0513 01/11/21 0640  WBC 11.7* 12.6* 14.3* 16.6*  NEUTROABS 9.5*  --   --   --   HGB 17.4* 17.1* 17.1* 16.8  HCT 51.5 52.4* 52.5* 51.1  MCV 90.2 93.4 94.3 92.7  PLT 408* 448* 411* 399   Cardiac Enzymes: No results for input(s):  CKTOTAL, CKMB, CKMBINDEX, TROPONINI in the last 168 hours. Sepsis Labs: Recent Labs  Lab 12/28/2020 2047 01/08/21 0030 01/09/21 0540 01/10/21 0513 01/11/21 0640  WBC 11.7*  --  12.6* 14.3* 16.6*  LATICACIDVEN 2.2* 2.3*  --   --   --     Procedures/Operations     Siah Steely Jan 20, 2021, 1:29 PM

## 2021-02-10 NOTE — Progress Notes (Addendum)
Patient started becoming more weak and fatigued yesterday evening. He was complaining of more abdominal pain aswell.  Overnight he has developed what sounds like terminal secretions.  I have notified NP on call, Respiratory, and Rapid response for suggestions.  I have attempted to suction patient, but secretions are in tracheal area. I gave him IV robinul for secretions. Patient unable to cough anything up. At this time patient is alert and drinking water, but he is physically declining/appears to have taken a turn. I asked patient if he would like his son to be called and he agreed. Son was notified and given permission to see patient at this time. Will give morphine now for discomfort and secretions.  Family still agrees to palliative pathway, but would want patient to be drained for palliative purposes if necessary. Will monitor patient and keep him comfortable. Roderick Pee

## 2021-02-10 NOTE — Care Management Important Message (Signed)
Important Message  Patient Details IM Letter placed in Patient's room. Name: Stephen Turner MRN: [id number] Date of Birth: January 26, 1923   Medicare Important Message Given:  Yes     [name] 05-Feb-2021, 11:01 AM

## 2021-02-10 NOTE — Progress Notes (Addendum)
Family at bedside. Dr. Tana Coast and Dr. Domingo Cocking in to speak with family. Morphine gtt was started for comfort care. Scopoline patch applied to right ear. Patient expired at 12:43. No pulse or respirations noted. Patient was incontinent of stool, green bile was released from his mouth. Stephan Minister was the 2nd RN to verify the patient had no pulse or respirations. Dr. Tana Coast and Dr Domingo Cocking notified. Also Dawn our [facility] and bed placement. Post mortuum was done with the daughter present, as she requested to be with her dad until the end. Patient was then brought to the morgue.

## 2021-02-10 NOTE — Progress Notes (Addendum)
[facility]                                                                              Patient Demographics  Stephen Turner, is a 87 y.o. male, DOB - October 18, 1922, DXI:[id number]  Admit date - 01/06/2021   Admitting Physician Stephen Emerald, MD  Outpatient Primary MD for the patient is Stephen Battles, MD  Outpatient specialists:   LOS - 5  days   Medical records reviewed and are as summarized below:    Chief Complaint  Patient presents with   Abdominal Pain       Brief summary   Patient is a 87 year old male with hypertension, hyperlipidemia, arthritis presented to ED for increasing abdominal distention, low-grade fevers, poor oral intake and not feeling well.  Patient had a recent paracentesis done on 01/06/2021 prior to the admission and had drained 4.2 L of fluid.  He was contacted and advised to go to the ER due to concerns of possible SBP.  Patient complained of diffuse abdominal pain associated with poor oral intake and decreased urine output.  Patient had low-grade fevers at home, temp of 100 F. CT abdomen pelvis showed moderate ascites with omental thickening/caking and multiple peritoneal cystic masses concerning for peritoneal carcinomatosis Patient was admitted for further work-up.   Assessment & Plan     New onset ascites with concern for SBP -Patient presented with new onset ascites, status post recent paracentesis on 6/27, low-grade fevers, poor oral intake -Status post paracentesis, was noted to have 80% neutrophils with ascitic fluid turbid, concerning for SBP.  Patient was placed on IV Rocephin. -Underwent paracentesis x2 on 7/1, drained 4.4 L, 88% neutrophils, -Overnight, patient deteriorated further.  On examination this morning having terminal secretions, shortness of breath, family at the bedside.  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 Tumor markers showed CA 19-9 71, PSA 5.95, CEA normal -CT chest showed right thyroid mass, possible esophagitis.  11x 8 mm lymph node in anterior portion of the right hemidiaphragm concerning for possible metastatic disease -Underwent CT-guided biopsy per IR on 7/1.  Biopsy results pending  -Right thyroid lobe with 3.7 cm nodule, recommended FNAC given size> 1.5 cm.  Thyroid function fairly stable.  Discussed with the patient and daughter, no further invasive procedures. -Comfort measures only    Dehydration with hyponatremia, poor oral intake, lactic acidosis, hyperkalemia -Comfort measures  Acute kidney injury -Likely due to peritoneal carcinomatosis, malignancy, poor oral intake  -Continue comfort care goals     Physical deconditioning, FTT, insomnia with poor appetite -Likely due to malignancy, patient was placed on remeron -Continue comfort measures  Code Status: dnr DVT Prophylaxis:     Level of Care: Level of care: Telemetry Family Communication: Goals of care addressed again today at bedside with patient's daughter, son and grandson.  Family wishes to proceed with comfort measures at this time.   Disposition Plan:     Status is: Inpatient  Remains inpatient appropriate because:Inpatient level of care appropriate due  to severity of illness.  Stephen Turner is actively dying, expected hospital death, prognosis-hours   Time Spent in minutes 25 mins   Procedures:  CT-guided biopsy of indeterminate omental mass in the left upper abdominal quadrant Paracentesis 7/1  Consultants:   Oncology  Interventional etiology Palliative medicine  Antimicrobials:   Anti-infectives (From admission, onward)    Start     Dose/Rate Route Frequency Ordered Stop   01/08/21 1600  cefTRIAXone (ROCEPHIN) 2 g in sodium chloride 0.9 % 100 mL IVPB  Status:  Discontinued        2 g 200 mL/hr over 30 Minutes  Intravenous Daily 01/08/21 1424 01-25-21 0753   01/08/21 0115  cefTRIAXone (ROCEPHIN) 1 g in sodium chloride 0.9 % 100 mL IVPB  Status:  Discontinued        1 g 200 mL/hr over 30 Minutes Intravenous Every 24 hours 01/08/21 0102 01/08/21 1424          Medications  Scheduled Meds:  sodium chloride flush  3 mL Intravenous Q12H   Continuous Infusions:  sodium chloride     morphine     PRN Meds:.sodium chloride, antiseptic oral rinse, glycopyrrolate **OR** glycopyrrolate **OR** glycopyrrolate, LORazepam **OR** LORazepam **OR** LORazepam, morphine injection, [DISCONTINUED] ondansetron **OR** ondansetron (ZOFRAN) IV, polyvinyl alcohol, sodium chloride flush      Subjective:   Stephen Turner was seen and examined today.  Received a page this morning regarding overnight declining of the patient.  Family at the bedside, son, daughter and grandson.  Patient currently unresponsive, terminal secretions, shortness of breath.   Objective:   Vitals:   01/12/21 1420 01/12/21 2035 01/12/21 2100 Jan 25, 2021 0001  BP:  103/71  101/68  Pulse:  93  89  Resp:  16  18  Temp:  97.7 F (36.5 C)  97.9 F (36.6 C)  TempSrc:  Oral  Oral  SpO2: 92% 91% 94% 90%  Weight:      Height:        Intake/Output Summary (Last 24 hours) at 01/25/2021 0755 Last data filed at 01-25-2021 0600 Gross per 24 hour  Intake 521.6 ml  Output 300 ml  Net 221.6 ml      Wt Readings from Last 3 Encounters:  01/09/21 73.7 kg  07/23/20 75.4 kg  07/09/20 73.7 kg   Physical Exam General: Unresponsive, terminal secretions Cardiovascular: S1 S2 clear, RRR. No pedal edema b/l Respiratory: coarse breath sounds throughout Gastrointestinal: Distended Ext: no pedal edema bilaterally Psych: unresponsive    Data Reviewed:  I have personally reviewed following labs and imaging studies  Micro Results Recent Results (from the past 240 hour(s))  Acid Fast Smear (AFB)     Status: None   Collection Time: 01/06/21 12:26 PM    Specimen: Abdomen; Peritoneal Fluid  Result Value Ref Range Status   AFB Specimen Processing Concentration  Final   Acid Fast Smear Negative  Final    Comment: (NOTE) Performed At: [facility] [address] [place], [place] [id number] Rush Farmer MD [id number]    Source (AFB) ABDOMEN  Final    Comment: Performed at [facility], [place] [address], [place], [place] [address]  Blood culture (routine x 2)     Status: None (Preliminary result)   Collection Time: 01/08/21 12:30 AM   Specimen: BLOOD  Result Value Ref Range Status   Specimen Description   Final    BLOOD BOTTLES DRAWN AEROBIC AND ANAEROBIC Performed at [facility], [address], [place], [place]  [address]    Special Requests   Final    BLOOD RIGHT HAND Blood Culture adequate volume Performed at [facility], [address] [place], [place] [address]    Culture   Final    NO GROWTH 4 DAYS Performed at [facility], [place] [address], [place], [place] [address]    Report Status PENDING  Incomplete  Blood culture (routine x 2)     Status: None (Preliminary result)   Collection Time: 01/08/21 12:30 AM   Specimen: BLOOD  Result Value Ref Range Status   Specimen Description   Final    BLOOD BOTTLES DRAWN AEROBIC AND ANAEROBIC Performed at [facility], [address], [place], [place] [address]    Special Requests   Final    LEFT ANTECUBITAL Blood Culture adequate volume Performed at [facility], [address] [place], [place] [address]    Culture   Final    NO GROWTH 4 DAYS Performed at [facility], [place] [address], [place], [place] [address]    Report Status PENDING  Incomplete  Resp Panel by RT-PCR (Flu A&B, Covid) Nasopharyngeal Swab     Status: None   Collection Time: 01/08/21  1:27 AM   Specimen: Nasopharyngeal Swab; Nasopharyngeal(NP) swabs in vial transport medium  Result Value Ref Range  Status   SARS Coronavirus 2 by RT PCR NEGATIVE NEGATIVE Final    Comment: (NOTE) SARS-CoV-2 target nucleic acids are NOT DETECTED.  The SARS-CoV-2 RNA is generally detectable in upper respiratory specimens during the acute phase of infection. The lowest concentration of SARS-CoV-2 viral copies this assay can detect is 138 copies/mL. A negative result does not preclude SARS-Cov-2 infection and should not be used as the sole basis for treatment or other patient management decisions. A negative result may occur with  improper specimen collection/handling, submission of specimen other than nasopharyngeal swab, presence of viral mutation(s) within the areas targeted by this assay, and inadequate number of viral copies(<138 copies/mL). A negative result must be combined with clinical observations, patient history, and epidemiological information. The expected result is Negative.  Fact Sheet for Patients:  EntrepreneurPulse.com.au  Fact Sheet for Healthcare Providers:  IncredibleEmployment.be  This test is no t yet approved or cleared by the [place] FDA and  has been authorized for detection and/or diagnosis of SARS-CoV-2 by FDA under an Emergency Use Authorization (EUA). This EUA will remain  in effect (meaning this test can be used) for the duration of the COVID-19 declaration under Section 564(b)(1) of the Act, 21 U.S.C.section 360bbb-3(b)(1), unless the authorization is terminated  or revoked sooner.       Influenza A by PCR NEGATIVE NEGATIVE Final   Influenza B by PCR NEGATIVE NEGATIVE Final    Comment: (NOTE) The Xpert Xpress SARS-CoV-2/FLU/RSV plus assay is intended as an aid in the diagnosis of influenza from Nasopharyngeal swab specimens and should not be used as a sole basis for treatment. Nasal washings and aspirates are unacceptable for Xpert Xpress SARS-CoV-2/FLU/RSV testing.  Fact Sheet for  Patients: EntrepreneurPulse.com.au  Fact Sheet for Healthcare Providers: IncredibleEmployment.be  This test is not yet approved or cleared by the [place] FDA and has been authorized for detection and/or diagnosis of SARS-CoV-2 by FDA under an Emergency Use Authorization (EUA). This EUA will remain in effect (meaning this test can be used) for the duration of the COVID-19 declaration under Section 564(b)(1) of the Act, 21 U.S.C. section 360bbb-3(b)(1), unless the authorization is terminated or  revoked.  Performed at [facility], [address], [place], [place] [address]     Radiology Reports DG Chest 1 View  Result Date: 01/08/2021 CLINICAL DATA:  Peritonitis, leukocytosis EXAM: CHEST  1 VIEW COMPARISON:  07/09/2020 FINDINGS: Lungs are well expanded, symmetric, and clear. No pneumothorax or pleural effusion. Cardiac size within normal limits. Pulmonary vascularity is normal. Osseous structures are age-appropriate. No acute bone abnormality. IMPRESSION: No active disease. Electronically Signed   By: Fidela Salisbury MD   On: 01/08/2021 00:27   CT CHEST W CONTRAST  Result Date: 01/09/2021 CLINICAL DATA:  Cancer of unknown primary. EXAM: CT CHEST WITH CONTRAST TECHNIQUE: Multidetector CT imaging of the chest was performed during intravenous contrast administration. CONTRAST:  58mL OMNIPAQUE IOHEXOL 300 MG/ML  SOLN COMPARISON:  January 08, 2021. FINDINGS: Cardiovascular: Atherosclerosis of thoracic aorta is noted without aneurysm or dissection. Normal cardiac size. No pericardial effusion. Mild coronary calcifications are noted. Mediastinum/Nodes: 4 cm right thyroid mass is noted. Mild diffuse esophageal wall thickening is noted suggesting possible esophagitis. No definite mediastinal or axillary adenopathy is noted. Lungs/Pleura: No pneumothorax or pleural effusion is noted. Minimal bibasilar subsegmental atelectasis is noted. Upper  Abdomen: As noted on prior exam, ascites is noted as well as omental caking suggesting peritoneal carcinomatosis. 11 x 8 mm lymph node is seen superior to the anterior portion of the right hemidiaphragm concerning for possible metastatic disease. Musculoskeletal: No chest wall abnormality. No acute or significant osseous findings. IMPRESSION: 4 cm right thyroid mass is noted. Recommend thyroid US. (Ref: J Am Coll Radiol. 2015 Feb;12(2): 143-50). Mild coronary artery calcifications are noted. Mild diffuse esophageal wall thickening is noted suggesting possible esophagitis. Endoscopy is recommended for further evaluation. 11 x 8 mm lymph node is noted superior to the anterior portion of the right hemidiaphragm concerning for possible metastatic disease. Aortic Atherosclerosis (ICD10-I70.0). Electronically Signed   By: Marijo Conception M.D.   On: 01/09/2021 13:58   CT ABDOMEN PELVIS W CONTRAST  Result Date: 01/08/2021 CLINICAL DATA:  Abdominal pain.  Recent paracentesis EXAM: CT ABDOMEN AND PELVIS WITH CONTRAST TECHNIQUE: Multidetector CT imaging of the abdomen and pelvis was performed using the standard protocol following bolus administration of intravenous contrast. CONTRAST:  5mL OMNIPAQUE IOHEXOL 300 MG/ML  SOLN COMPARISON:  None. FINDINGS: Lower chest: No acute abnormality. Hepatobiliary: Small cyst in the inferior right hepatic lobe. No suspicious focal hepatic abnormality. Gallbladder unremarkable. Pancreas: No focal abnormality or ductal dilatation. Spleen: No focal abnormality.  Normal size. Adrenals/Urinary Tract: Left renal parapelvic cysts. No hydronephrosis. No renal or adrenal mass. Small diverticulum off the posterior left bladder wall. No bladder wall thickening. Stomach/Bowel: Scattered colonic diverticulosis. No bowel obstruction. Stomach and small bowel decompressed, grossly unremarkable. Vascular/Lymphatic: Calcified aorta. No evidence of aneurysm or adenopathy. Reproductive: Mildly prominent  prostate. Other: Moderate ascites throughout the abdomen and pelvis. There is omental thickening/caking. Peritoneal mass seen in the left upper quadrant adjacent to the spleen measuring up to 7 cm. Other peritoneal masses adjacent to the splenic flexure of the colon measuring 3.2 cm. Musculoskeletal: No acute bony abnormality. IMPRESSION: Moderate ascites with omental thickening/caking and multiple peritoneal cystic masses. 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 Aortic atherosclerosis. Electronically Signed   By: Rolm Baptise M.D.   On: 01/08/2021 00:47   CT BIOPSY  Result Date: 01/10/2021 INDICATION: No known primary, now with concern for omental caking and malignant ascites. Please perform CT-guided biopsy of omental mass within the left upper abdominal quadrant and CT-guided paracentesis.  EXAM: 1. CT-GUIDED BIOPSY OF OMENTAL MASS 2. CT-GUIDED PARACENTESIS COMPARISON:  CT abdomen and pelvis-01/08/2021 MEDICATIONS: None. ANESTHESIA/SEDATION: Fentanyl 25 mcg IV; Versed 0.5 mg IV Sedation time: 12 minutes; The patient was continuously monitored during the procedure by the interventional radiology nurse under my direct supervision. CONTRAST:  None. COMPLICATIONS: None immediate. PROCEDURE: Informed consent was obtained from the patient following an explanation of the procedure, risks, benefits and alternatives. A time out was performed prior to the initiation of the procedure. The patient was positioned supine on the CT table and a limited CT was performed for procedural planning demonstrating unchanged size and appearance of the approximately 3.6 x 3.4 cm omental mass within the left upper abdominal quadrant (image 45) as well as recurrent at least moderate volume intra-abdominal ascites. The procedures were planned. The operative sites were prepped and draped in the usual sterile fashion. A spot along the inferolateral aspect of the left abdomen was anesthetized with 1% lidocaine with epinephrine  allowing for placement of a Safe-T-Centesis catheter. Appropriate position was confirmed with CT imaging and the paracentesis was initiated. Next, the omental mass with the left upper abdominal quadrant was targeted with a 17 gauge coaxial needle after the adjacent subcutaneous tissues were anesthetized 1% lidocaine with epinephrine. Appropriate position was confirmed with CT imaging. Next, 8 core needle biopsy samples were obtained with an 18 gauge core needle biopsy device. The co-axial needle was removed and hemostasis was achieved with manual compression. The paracentesis was completed and the CT centesis catheter was removed intact. A limited postprocedural CT was negative for hemorrhage or additional complication. A dressing was applied. The patient tolerated the procedure well without immediate postprocedural complication. IMPRESSION: 1. Technically successful CT guided core needle biopsy of omental mass within the left upper abdominal quadrant. 2. Technically successful CT guided paracentesis yielding 4.4 L of serous fluid. Electronically Signed   By: Sandi Mariscal M.D.   On: 01/10/2021 15:40   DG CHEST PORT 1 VIEW  Result Date: 01/12/2021 CLINICAL DATA:  Shortness of breath and hypertension. Former smoker. EXAM: PORTABLE CHEST 1 VIEW COMPARISON:  01/08/2021 FINDINGS: Slightly shallow inspiration. Heart size and pulmonary vascularity are normal. Emphysematous changes are suggested in the lungs. No airspace disease or consolidation. No pleural effusions. No pneumothorax. Mediastinal contours appear intact. Calcification of the aorta. IMPRESSION: No active disease. Electronically Signed   By: Lucienne Capers M.D.   On: 01/12/2021 21:15   CT Paracentesis  Result Date: 01/10/2021 INDICATION: No known primary, now with concern for omental caking and malignant ascites. Please perform CT-guided biopsy of omental mass within the left upper abdominal quadrant and CT-guided paracentesis. EXAM: 1. CT-GUIDED BIOPSY  OF OMENTAL MASS 2. CT-GUIDED PARACENTESIS COMPARISON:  CT abdomen and pelvis-01/08/2021 MEDICATIONS: None. ANESTHESIA/SEDATION: Fentanyl 25 mcg IV; Versed 0.5 mg IV Sedation time: 12 minutes; The patient was continuously monitored during the procedure by the interventional radiology nurse under my direct supervision. CONTRAST:  None. COMPLICATIONS: None immediate. PROCEDURE: Informed consent was obtained from the patient following an explanation of the procedure, risks, benefits and alternatives. A time out was performed prior to the initiation of the procedure. The patient was positioned supine on the CT table and a limited CT was performed for procedural planning demonstrating unchanged size and appearance of the approximately 3.6 x 3.4 cm omental mass within the left upper abdominal quadrant (image 45) as well as recurrent at least moderate volume intra-abdominal ascites. The procedures were planned. The operative sites were prepped and draped in the  usual sterile fashion. A spot along the inferolateral aspect of the left abdomen was anesthetized with 1% lidocaine with epinephrine allowing for placement of a Safe-T-Centesis catheter. Appropriate position was confirmed with CT imaging and the paracentesis was initiated. Next, the omental mass with the left upper abdominal quadrant was targeted with a 17 gauge coaxial needle after the adjacent subcutaneous tissues were anesthetized 1% lidocaine with epinephrine. Appropriate position was confirmed with CT imaging. Next, 8 core needle biopsy samples were obtained with an 18 gauge core needle biopsy device. The co-axial needle was removed and hemostasis was achieved with manual compression. The paracentesis was completed and the CT centesis catheter was removed intact. A limited postprocedural CT was negative for hemorrhage or additional complication. A dressing was applied. The patient tolerated the procedure well without immediate postprocedural complication.  IMPRESSION: 1. Technically successful CT guided core needle biopsy of omental mass within the left upper abdominal quadrant. 2. Technically successful CT guided paracentesis yielding 4.4 L of serous fluid. Electronically Signed   By: Sandi Mariscal M.D.   On: 01/10/2021 15:40   US THYROID  Result Date: 01/10/2021 CLINICAL DATA:  Incidental on CT. EXAM: THYROID ULTRASOUND TECHNIQUE: Ultrasound examination of the thyroid gland and adjacent soft tissues was performed. COMPARISON:  None. FINDINGS: Parenchymal Echotexture: Mildly heterogenous Isthmus: 0.4 cm Right lobe: 4.5 x 3.1 x 3.0 cm Left lobe: 2.9 x 1.5 x 1.0 cm _________________________________________________________ Estimated total number of nodules >/= 1 cm: 1 Number of spongiform nodules >/=  2 cm not described below (TR1): 0 Number of mixed cystic and solid nodules >/= 1.5 cm not described below (TR2): 0 _________________________________________________________ Nodule # 1: Location: Right; mid Maximum size: 3.7 cm; Other 2 dimensions: 2.9 x 3.5 cm Composition: solid/almost completely solid (2) Echogenicity: isoechoic (1) Shape: taller-than-wide (3) Margins: ill-defined (0) Echogenic foci: none (0) ACR TI-RADS total points: 6. ACR TI-RADS risk category: TR4 (4-6 points). ACR TI-RADS recommendations: **Given size (>/= 1.5 cm) and appearance, fine needle aspiration of this moderately suspicious nodule should be considered based on TI-RADS criteria. _________________________________________________________ Subcentimeter left thyroid nodule does not meet criteria for FNA or surveillance. IMPRESSION: Nodule 1 (TI-RADS 4) located in the mid right thyroid lobe should be further evaluated with FNA. The above is in keeping with the ACR TI-RADS recommendations - J Am Coll Radiol 2017;14:587-595. Electronically Signed   By: Miachel Roux M.D.   On: 01/10/2021 11:41   IR Paracentesis  Result Date: 01/06/2021 INDICATION: Abdominal distention with ascites. Request for  therapeutic and diagnostic paracentesis. EXAM: ULTRASOUND GUIDED RIGHT LOWER QUADRANT PARACENTESIS MEDICATIONS: % plain lidocaine, 5 mL COMPLICATIONS: None immediate. PROCEDURE: Informed written consent was obtained from the patient after a discussion of the risks, benefits and alternatives to treatment. A timeout was performed prior to the initiation of the procedure. Initial ultrasound scanning demonstrates a large amount of ascites within the right lower abdominal quadrant. The right lower abdomen was prepped and draped in the usual sterile fashion. 1% lidocaine was used for local anesthesia. Following this, a 6 Fr Safe-T-Centesis catheter was introduced. An ultrasound image was saved for documentation purposes. The paracentesis was performed. The catheter was removed and a dressing was applied. The patient tolerated the procedure well without immediate post procedural complication. FINDINGS: A total of approximately 4.2 L of hazy, serosanguineous fluid was removed. Samples were sent to the laboratory as requested by the clinical team. IMPRESSION: Successful ultrasound-guided paracentesis yielding 4.2 liters of peritoneal fluid. Read by: Ascencion Dike PA-C Electronically Signed  By: Ruthann Cancer MD   On: 01/06/2021 12:39    Lab Data:  CBC: Recent Labs  Lab 12/20/2020 2047 01/09/21 0540 01/10/21 0513 01/11/21 0640  WBC 11.7* 12.6* 14.3* 16.6*  NEUTROABS 9.5*  --   --   --   HGB 17.4* 17.1* 17.1* 16.8  HCT 51.5 52.4* 52.5* 51.1  MCV 90.2 93.4 94.3 92.7  PLT 408* 448* 411* 498   Basic Metabolic Panel: Recent Labs  Lab 01/09/21 0540 01/10/21 0513 01/11/21 0640 01/12/21 0521 01/12/21 2056 2021/01/21 0448  NA 129* 128* 128* 130*  --  129*  K 4.8 5.0 4.5 5.7* 5.5* 5.8*  CL 99 100 99 101  --  101  CO2 18* 14* 18* 16*  --  15*  GLUCOSE 130* 129* 123* 113*  --  134*  BUN 83* 90* 84* 88*  --  92*  CREATININE 1.67* 1.63* 1.56* 1.69*  --  2.28*  CALCIUM 8.4* 8.3* 8.4* 8.9  --  9.7    GFR: Estimated Creatinine Clearance: 17.8 mL/min (A) (by C-G formula based on SCr of 2.28 mg/dL (H)). Liver Function Tests: Recent Labs  Lab 01/04/2021 2047 01/10/21 0513  AST 32 27  ALT 25 29  ALKPHOS 92 90  BILITOT 0.5 0.7  PROT 6.0* 5.8*  ALBUMIN 3.2* 2.7*   No results for input(s): LIPASE, AMYLASE in the last 168 hours. No results for input(s): AMMONIA in the last 168 hours. Coagulation Profile: No results for input(s): INR, PROTIME in the last 168 hours. Cardiac Enzymes: No results for input(s): CKTOTAL, CKMB, CKMBINDEX, TROPONINI in the last 168 hours. BNP (last 3 results) No results for input(s): PROBNP in the last 8760 hours. HbA1C: No results for input(s): HGBA1C in the last 72 hours. CBG: No results for input(s): GLUCAP in the last 168 hours. Lipid Profile: No results for input(s): CHOL, HDL, LDLCALC, TRIG, CHOLHDL, LDLDIRECT in the last 72 hours. Thyroid Function Tests: Recent Labs    01/10/21 0936 01/11/21 0640  TSH  --  2.849  FREET4 0.81  --    Anemia Panel: No results for input(s): VITAMINB12, FOLATE, FERRITIN, TIBC, IRON, RETICCTPCT in the last 72 hours. Urine analysis:    Component Value Date/Time   COLORURINE YELLOW 01/08/2021 0625   APPEARANCEUR CLEAR 01/08/2021 0625   LABSPEC >1.046 (H) 01/08/2021 0625   PHURINE 6.0 01/08/2021 0625   GLUCOSEU NEGATIVE 01/08/2021 0625   HGBUR NEGATIVE 01/08/2021 0625   BILIRUBINUR NEGATIVE 01/08/2021 0625   KETONESUR 15 (A) 01/08/2021 0625   PROTEINUR 30 (A) 01/08/2021 0625   NITRITE NEGATIVE 01/08/2021 0625   LEUKOCYTESUR NEGATIVE 01/08/2021 0625     Syesha Thaw M.D. [facility] 2021/01/21, 7:55 AM  Available via Epic secure chat 7am-7pm expressing concerns After 7 pm, please refer to night coverage provider listed on amion.

## 2021-02-10 NOTE — Progress Notes (Signed)
Daily Progress Note   Patient Name: Stephen Turner       Date: 02/08/21 DOB: August 25, 1922  Age: 87 y.o. MRN#: [id number] Attending Physician: Mendel Corning, MD Primary Care Physician: Leanna Battles, MD Admit Date: 12/15/2020  Reason for Consultation/Follow-up: Establishing goals of care  Subjective: Received page this AM that Mr. Huser acutely worsened overnight.  Discussed with Dr. Tana Coast and plan moving forward is for comfort care.  I met today with patient's son, daughter, and grandson in his room.  Mr. Sena is somnolent with terminal secretions noted.  He appears to be comfortable without signs of distress.  Discussed with family regarding acute changes and plan for comfort moving forward.  We discussed symptom management including treatment of pain, shortness of breath, anxiety, and agitation.  Anticipatory guidance on expectations moving forward regarding his clinical course and prognosis (which is likely hours) provided to family.  Length of Stay: 5  Current Medications: Scheduled Meds:   sodium chloride flush  3 mL Intravenous Q12H    Continuous Infusions:  sodium chloride     morphine      PRN Meds: sodium chloride, antiseptic oral rinse, glycopyrrolate **OR** glycopyrrolate **OR** glycopyrrolate, LORazepam **OR** LORazepam **OR** LORazepam, morphine, [DISCONTINUED] ondansetron **OR** ondansetron (ZOFRAN) IV, polyvinyl alcohol, sodium chloride flush  Physical Exam         General: Somnolent, upper airway secretions noted HEENT: No bruits, no goiter, no JVD Heart: Regular rate and rhythm. Lungs: Regular, coarse  Vital Signs: BP 101/68 (BP Location: Left Arm)   Pulse 89   Temp 97.9 F (36.6 C) (Oral)   Resp 18   Ht $R'5\' 4"'Gj$  (1.626 m)   Wt 73.7 kg   SpO2 90%   BMI  27.89 kg/m  SpO2: SpO2: 90 % O2 Device: O2 Device: Room Air O2 Flow Rate: O2 Flow Rate (L/min): 2.5 L/min  Intake/output summary:  Intake/Output Summary (Last 24 hours) at 02/08/2021 6389 Last data filed at 02-08-21 0600 Gross per 24 hour  Intake 521.6 ml  Output 300 ml  Net 221.6 ml    LBM: Last BM Date: 01/11/21 Baseline Weight: Weight: 73.7 kg Most recent weight: Weight: 73.7 kg       Palliative Assessment/Data:    Flowsheet Rows    Flowsheet Row Most Recent Value  Intake Tab  Referral Department Hospitalist  Unit at Time of Referral Med/Surg Unit  Palliative Care Primary Diagnosis Cancer  Date Notified 01/09/21  Palliative Care Type New Palliative care  Reason for referral Clarify Goals of Care  Date of Admission 01/04/2021  Date first seen by Palliative Care 01/10/21  # of days Palliative referral response time 1 Day(s)  # of days IP prior to Palliative referral 2  Clinical Assessment   Palliative Performance Scale Score 30%  Psychosocial & Spiritual Assessment   Palliative Care Outcomes   Patient/Family meeting held? Yes  Who was at the meeting? Patient       Patient Active Problem List   Diagnosis Date Noted   Abdominal pain 01/08/2021   SBP (spontaneous bacterial peritonitis) ([place]) 01/08/2021   Peritoneal carcinomatosis ([place]) 01/08/2021   Dehydration with hyponatremia 01/08/2021   Hyperkalemia 01/08/2021   Physical deconditioning 01/08/2021   Precordial pain    Angina pectoris ([place])    Hyperlipidemia with target low density lipoprotein (LDL) cholesterol less than 70 mg/dL    Hoarseness 01/04/2020   Throat clearing 01/04/2020   Gastroesophageal reflux disease 01/04/2020   Essential hypertension 11/01/2007   PERSONAL HISTORY OF COLONIC POLYPS 11/01/2007    Palliative Care Assessment & Plan   Patient Profile: 87 year old male with past medical history hypertension, hyperlipidemia, arthritis who was admitted with poor intake and abdominal  distention with concern for SBP and found to have imaging consistent with carcinomatosis involving the mesentery.  He is status postbiopsy and results are pending.  Palliative consulted for goals of care.  Recommendations/Plan: Plan for comfort moving forward. Pain/shortness of breath: Agree with morphine infusion.  Continue bolus dosing as needed for breakthrough symptoms. Anxiety: Ativan as needed Agitation: Haldol as needed Excess secretions: Robinul as needed Anticipated Hospital death.  I expect his prognosis at this point is likely limited to a number of hours. Visitation per end-of-life policy.  Code Status:    Code Status Orders  (From admission, onward)           Start     Ordered   01/08/21 1423  Do not attempt resuscitation (DNR)  Continuous       Question Answer Comment  In the event of cardiac or respiratory ARREST Do not call a "code blue"   In the event of cardiac or respiratory ARREST Do not perform Intubation, CPR, defibrillation or ACLS   In the event of cardiac or respiratory ARREST Use medication by any route, position, wound care, and other measures to relive pain and suffering. May use oxygen, suction and manual treatment of airway obstruction as needed for comfort.   Comments Patient requests DNR status      01/08/21 1424           Code Status History     This patient has a current code status but no historical code status.       Prognosis: Likely hours.  Discharge Planning: Anticipated Hospital Death  Care plan was discussed with patient, son, daughter  Thank you for allowing the Palliative Medicine Team to assist in the care of this patient.   Time In: 0755 Time Out: 0835 Total Time 40 Prolonged Time Billed No  Greater than 50%  of this time was spent counseling and coordinating care related to the above assessment and plan.  Micheline Rough, MD  Please contact Palliative Medicine Team phone at [phone] for questions and concerns.

## 2021-02-10 DEATH — deceased

## 2021-02-20 LAB — ACID FAST CULTURE WITH REFLEXED SENSITIVITIES (MYCOBACTERIA): Acid Fast Culture: NEGATIVE

## 2021-02-26 ENCOUNTER — Ambulatory Visit: Payer: Medicare Other | Admitting: General Practice

## 2022-10-22 IMAGING — CR DG CHEST 2V
2 series · 2 of 2 positions shown · non-contrast
Comparison: Prior chest radiographs 06/08/2016 and earlier.

CLINICAL DATA: Chest pain.

EXAM:
CHEST - 2 VIEW

[chest pa]
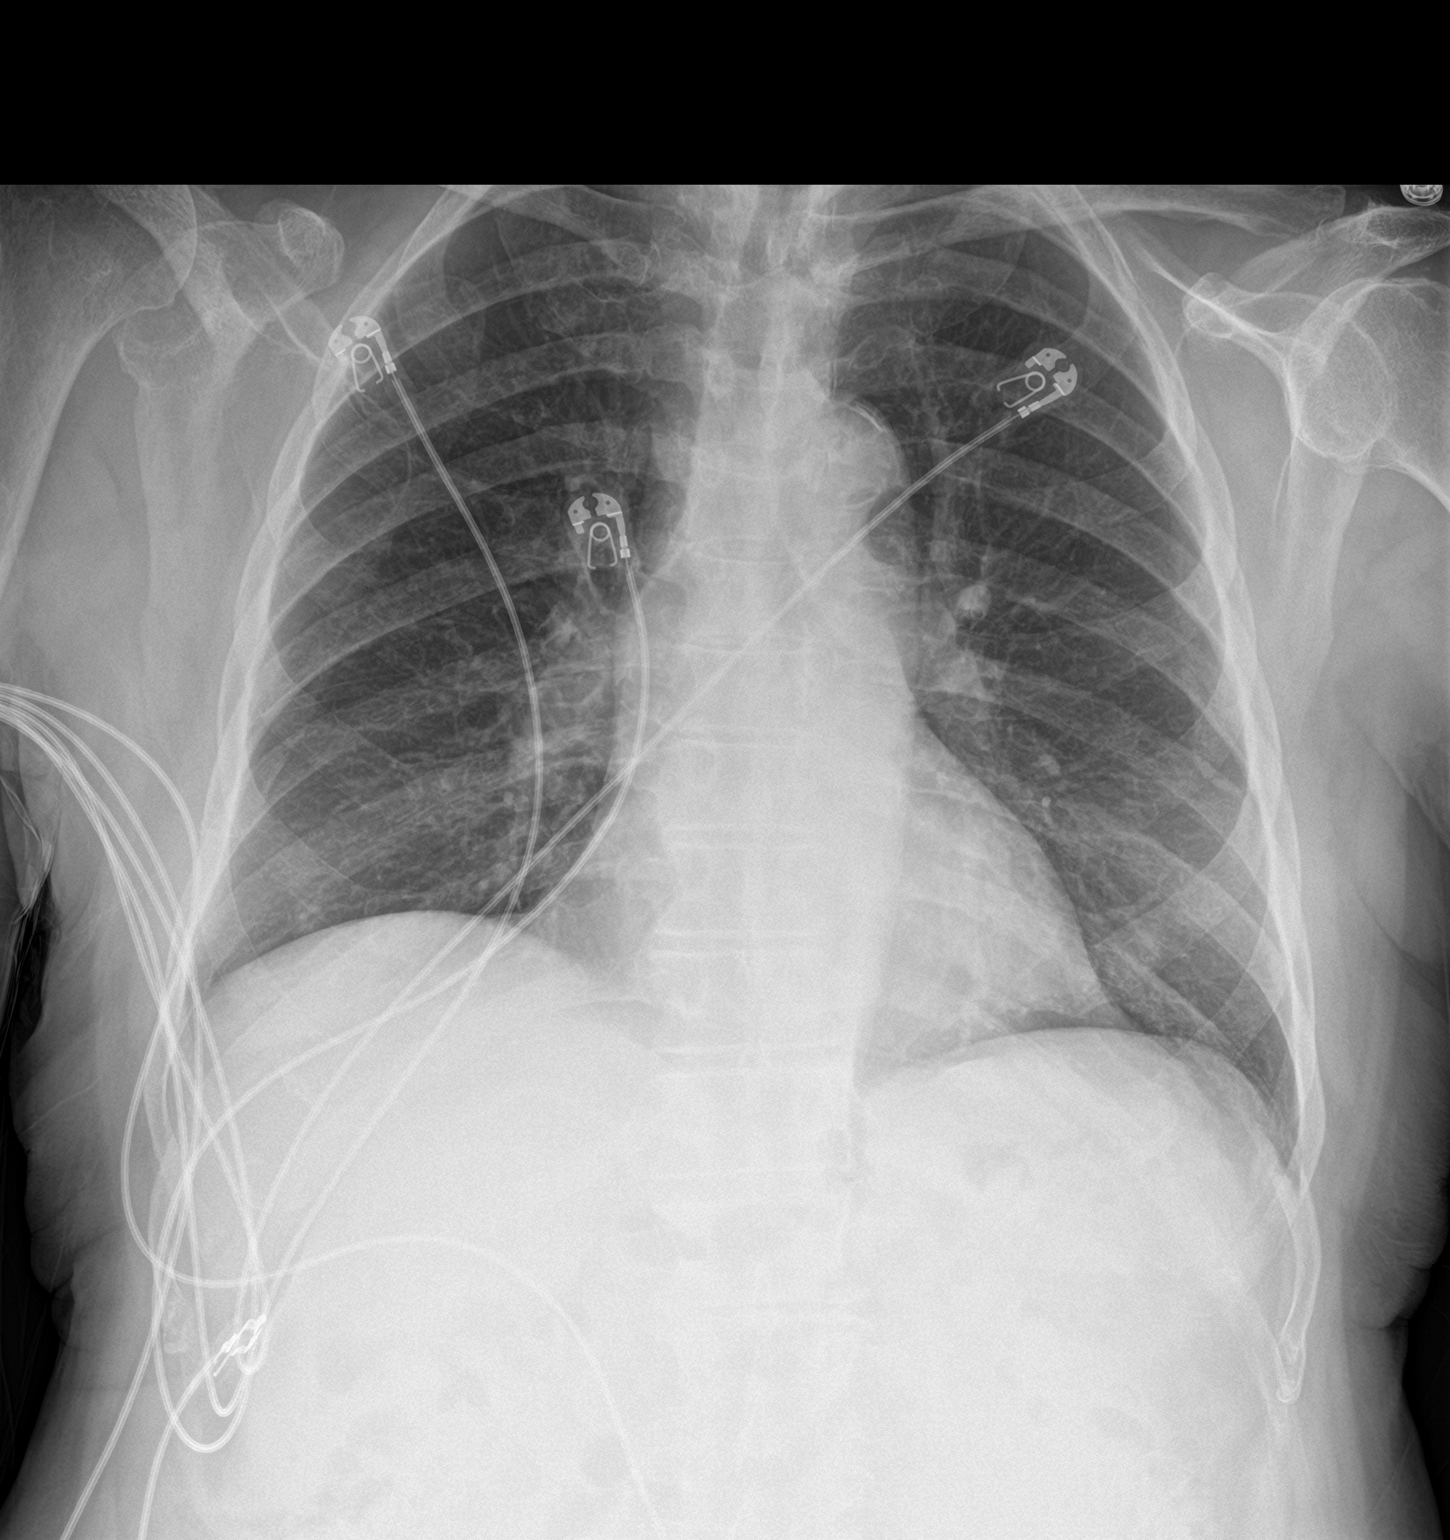

[chest lat]
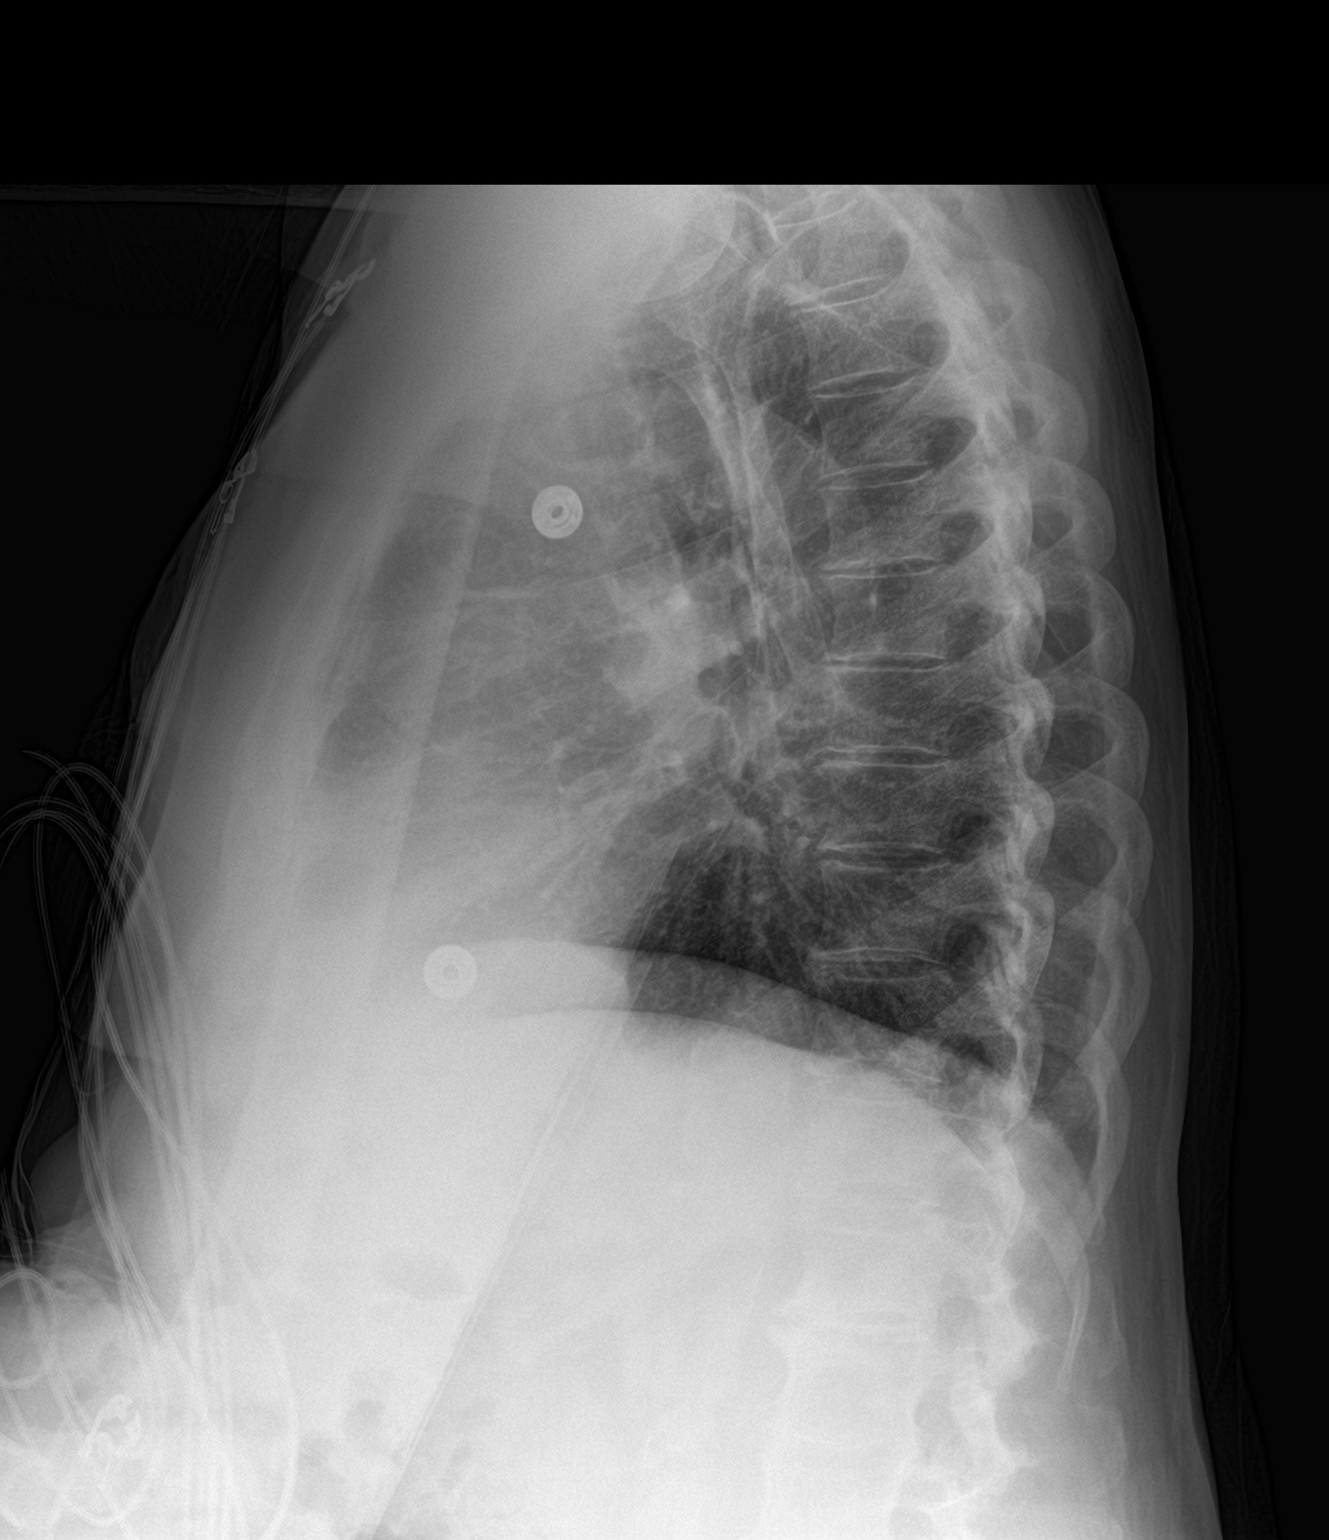

[2 of 2 positions shown; findings below may reference images not displayed]

FINDINGS: Heart size within normal limits. Aortic atherosclerosis. No
appreciable airspace consolidation or pulmonary edema. No evidence
of pleural effusion or pneumothorax. No acute bony abnormality
identified.
IMPRESSION: No evidence of acute cardiopulmonary abnormality.

Aortic Atherosclerosis (REGEM-[place]7.7).

## 2023-04-23 IMAGING — DX DG CHEST 1V
1 series · 1 of 1 positions shown · non-contrast
Comparison: 07/09/2020

CLINICAL DATA: Peritonitis, leukocytosis

EXAM:
CHEST  1 VIEW

[chest]
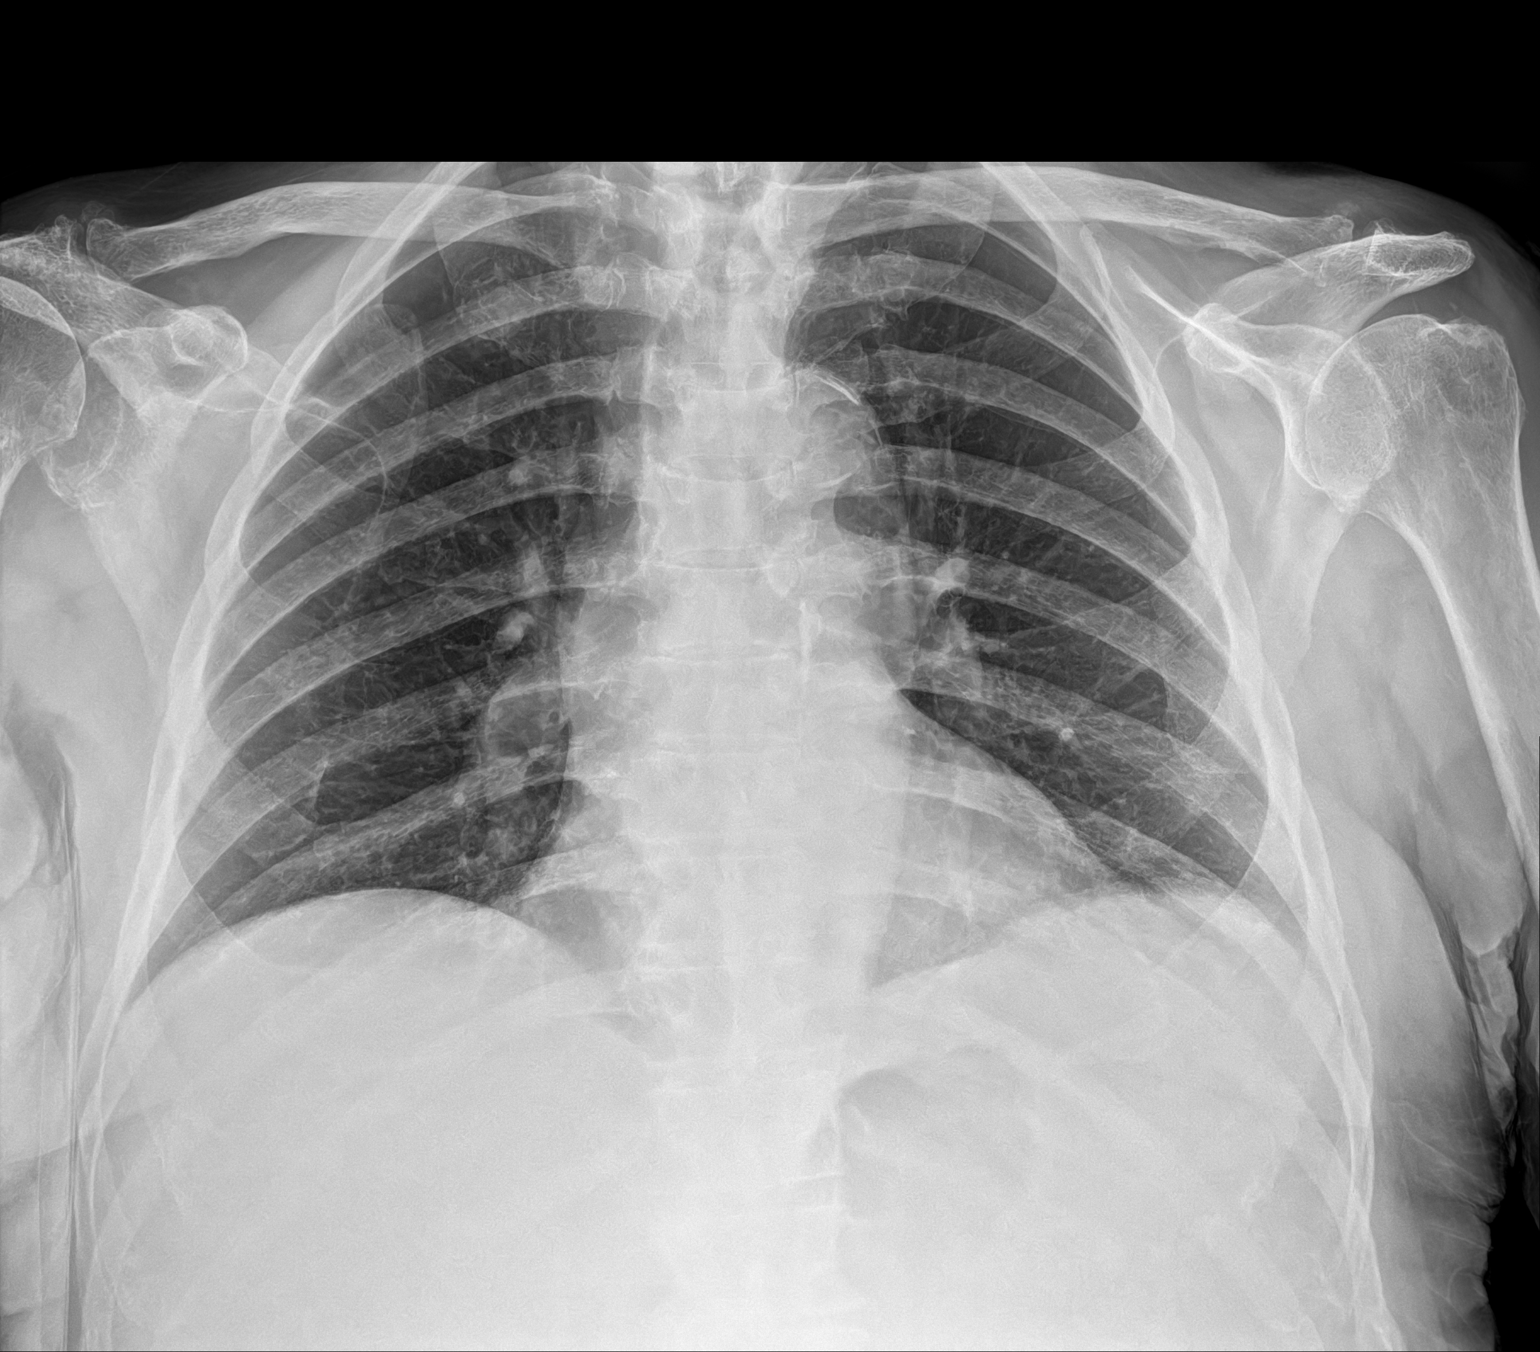

[1 of 1 positions shown; findings below may reference images not displayed]

FINDINGS: Lungs are well expanded, symmetric, and clear. No pneumothorax or
pleural effusion. Cardiac size within normal limits. Pulmonary
vascularity is normal. Osseous structures are age-appropriate. No
acute bone abnormality.
IMPRESSION: No active disease.

## 2023-04-23 IMAGING — CT CT ABD-PELV W/ CM
2 of 5 series · 16 of 46 positions shown, 18 images · IV contrast (APPLIED)
Comparison: None.

CLINICAL DATA: Abdominal pain.  Recent paracentesis

EXAM:
CT ABDOMEN AND PELVIS WITH CONTRAST
TECHNIQUE: Multidetector CT imaging of the abdomen and pelvis was performed
using the standard protocol following bolus administration of
intravenous contrast.
CONTRAST:  75mL OMNIPAQUE IOHEXOL 300 MG/ML  SOLN

[Series 2: abd pel w · axial · 0.82mm/px · z∈[+952,+1372]mm · 13 of 94 slices shown, 15 images]
[im 5/94  soft-tissue]
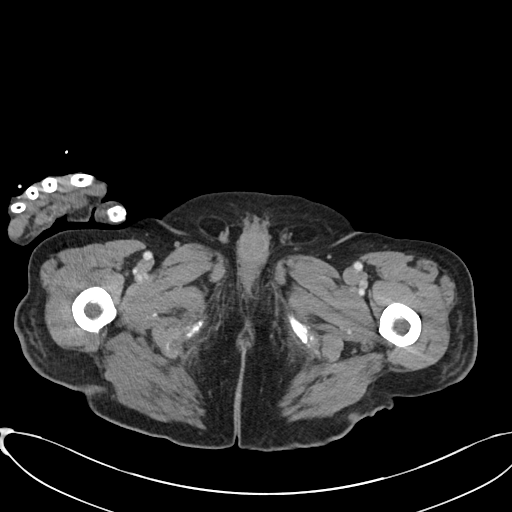
[im 5/94  bone]
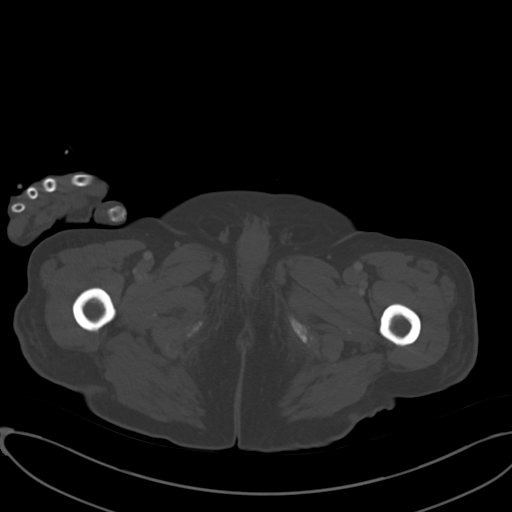
[im 15/94  soft-tissue]
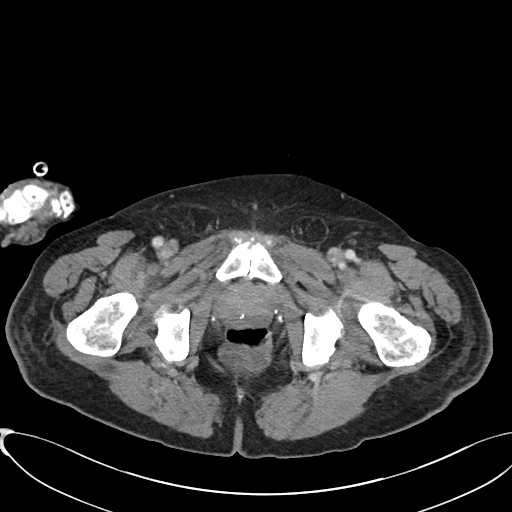
[im 20/94  soft-tissue]
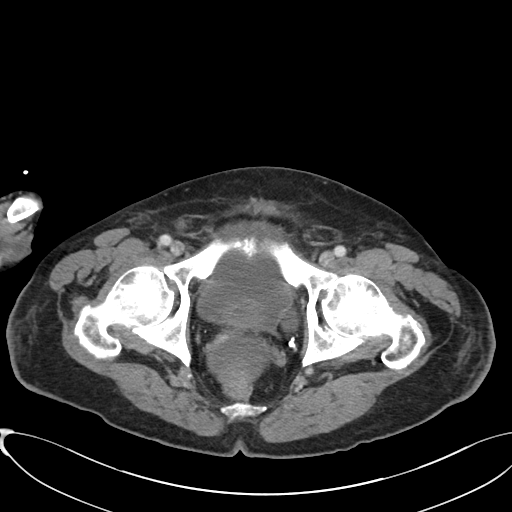
[im 25/94  soft-tissue]
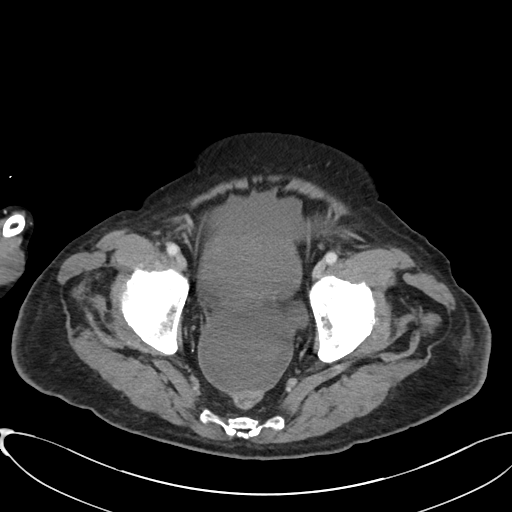
[im 35/94  soft-tissue]
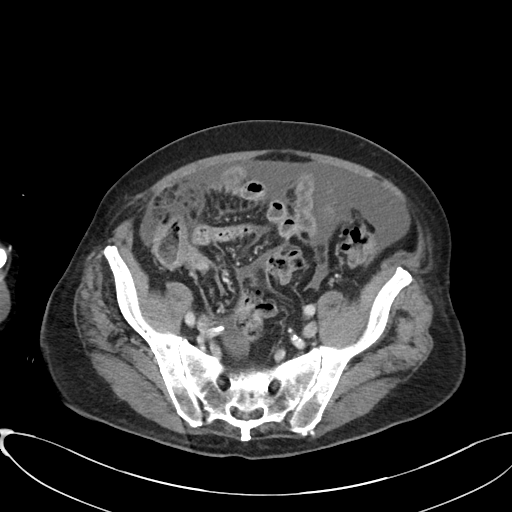
[im 40/94  soft-tissue]
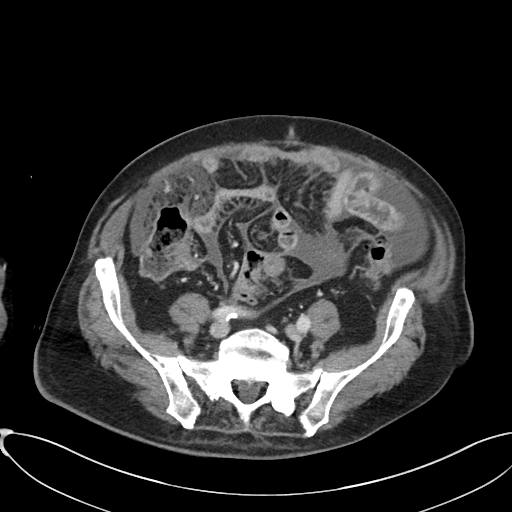
[im 49/94  soft-tissue]
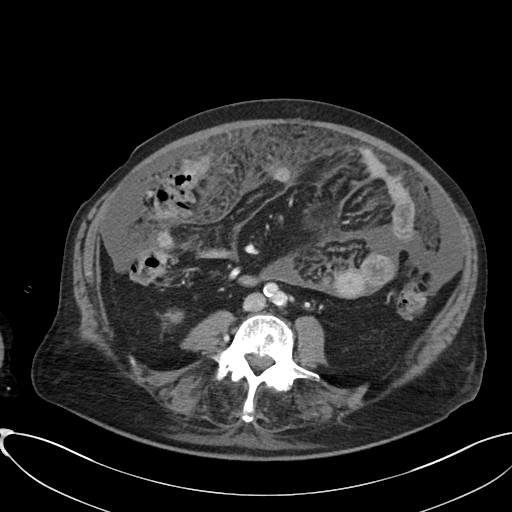
[im 54/94  soft-tissue]
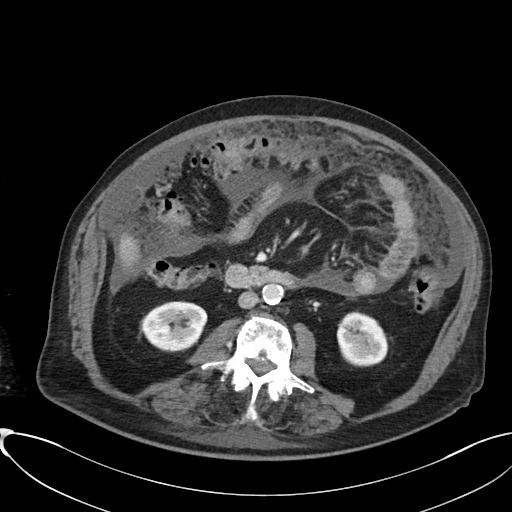
[im 59/94  soft-tissue]
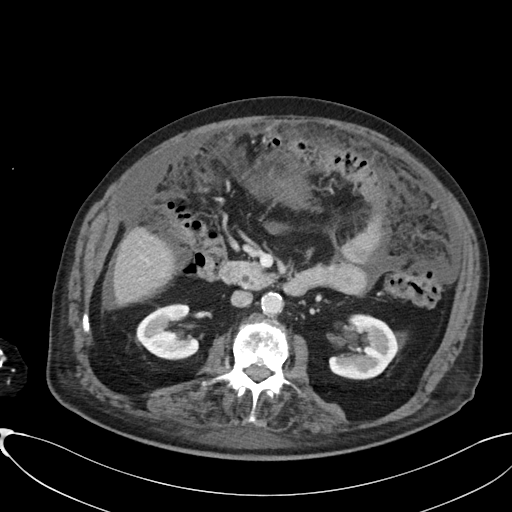
[im 59/94  bone]
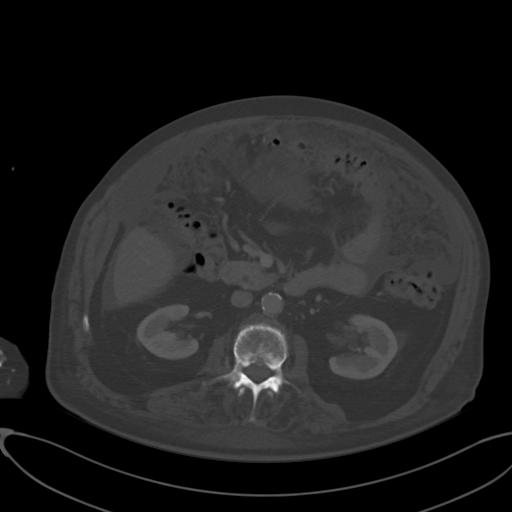
[im 69/94  soft-tissue]
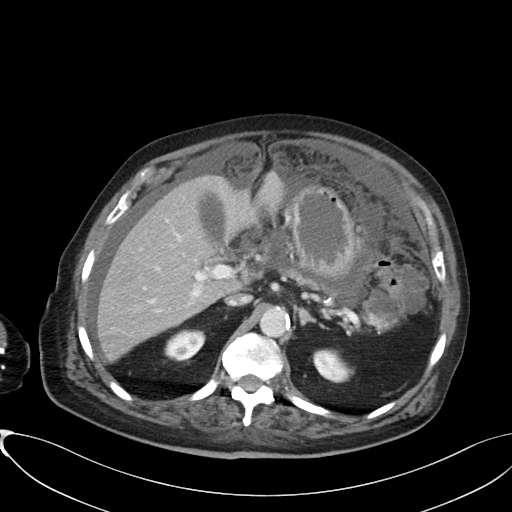
[im 74/94  soft-tissue]
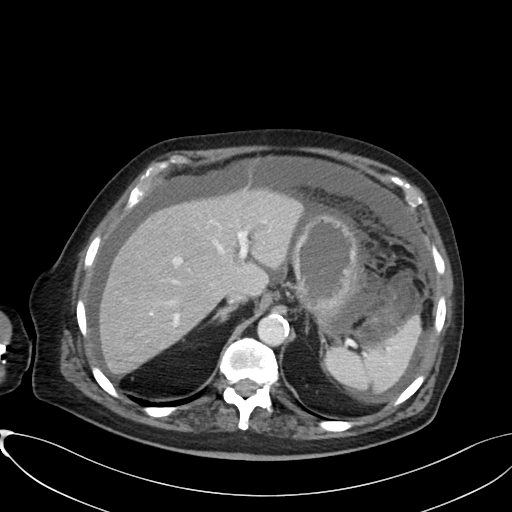
[im 79/94  soft-tissue]
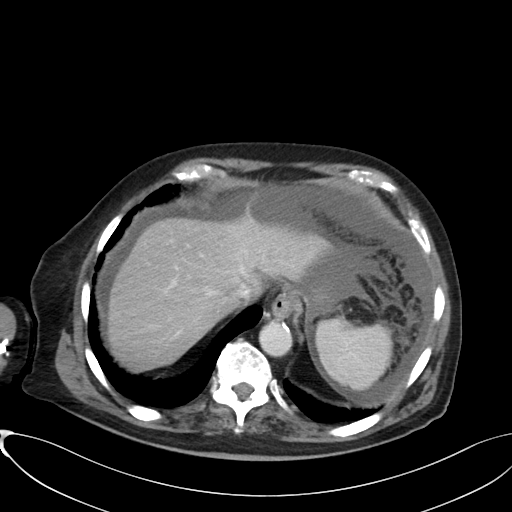
[im 89/94  soft-tissue]
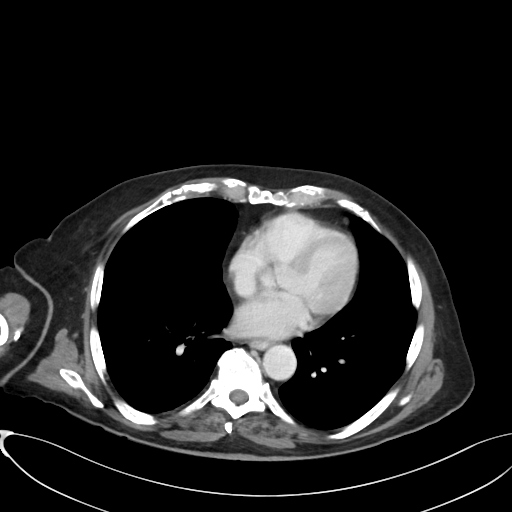

[Series 5: coronal · coronal · 0.79mm/px · 3 of 97 slices shown]
[im 33/97  soft-tissue]
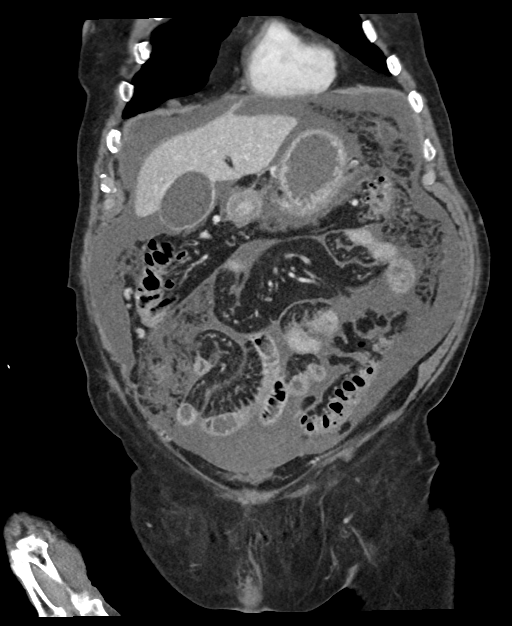
[im 43/97  soft-tissue]
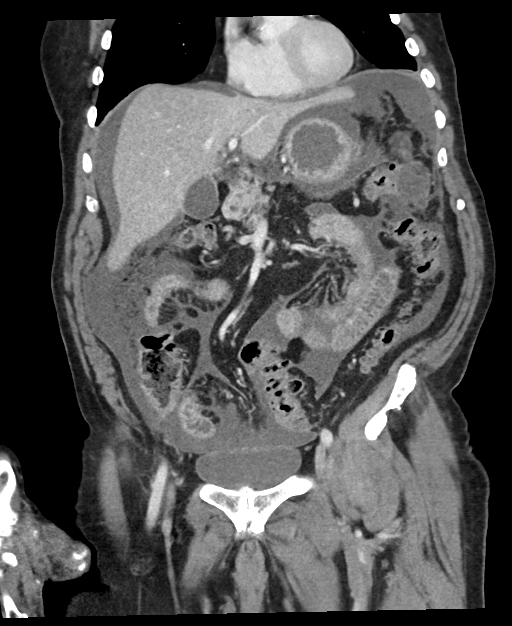
[im 54/97  soft-tissue]
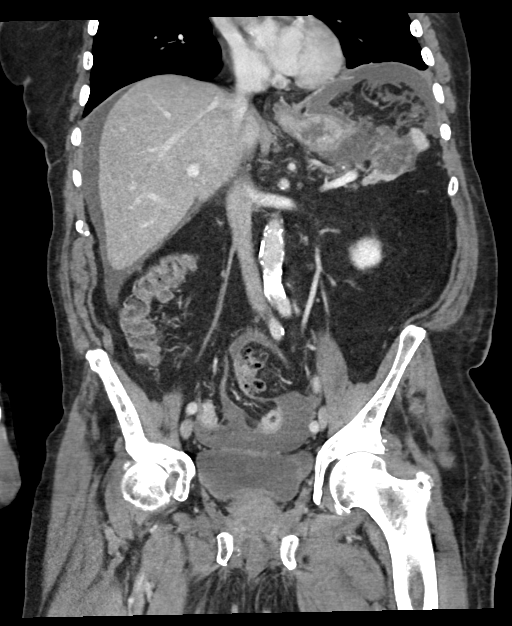

[16 of 46 positions shown; findings below may reference images not displayed]

FINDINGS: Lower chest: No acute abnormality.

Hepatobiliary: Small cyst in the inferior right hepatic lobe. No
suspicious focal hepatic abnormality. Gallbladder unremarkable.

Pancreas: No focal abnormality or ductal dilatation.

Spleen: No focal abnormality.  Normal size.

Adrenals/Urinary Tract: Left renal parapelvic cysts. No
hydronephrosis. No renal or adrenal mass. Small diverticulum off the
posterior left bladder wall. No bladder wall thickening.

Stomach/Bowel: Scattered colonic diverticulosis. No bowel
obstruction. Stomach and small bowel decompressed, grossly
unremarkable.

Vascular/Lymphatic: Calcified aorta. No evidence of aneurysm or
adenopathy.

Reproductive: Mildly prominent prostate.

Other: Moderate ascites throughout the abdomen and pelvis. There is
omental thickening/caking. Peritoneal mass seen in the left upper
quadrant adjacent to the spleen measuring up to 7 cm. Other
peritoneal masses adjacent to the splenic flexure of the colon
measuring 3.2 cm.

Musculoskeletal: No acute bony abnormality.
IMPRESSION: Moderate ascites with omental thickening/caking and multiple
peritoneal cystic masses. Appearance is concerning for peritoneal
carcinomatosis.

Aortic atherosclerosis.
